# Patient Record
Sex: Female | Born: 1935 | Hispanic: No | Smoking: Never smoker
Health system: Southern US, Community
[De-identification: ages and names within clinical notes are randomized; demographics above are authoritative.]

## PROBLEM LIST (undated history)

## (undated) DIAGNOSIS — R609 Edema, unspecified: Secondary | ICD-10-CM

## (undated) DIAGNOSIS — G4700 Insomnia, unspecified: Secondary | ICD-10-CM

## (undated) DIAGNOSIS — M1990 Unspecified osteoarthritis, unspecified site: Secondary | ICD-10-CM

## (undated) DIAGNOSIS — E669 Obesity, unspecified: Secondary | ICD-10-CM

## (undated) DIAGNOSIS — R600 Localized edema: Secondary | ICD-10-CM

## (undated) DIAGNOSIS — I10 Essential (primary) hypertension: Secondary | ICD-10-CM

## (undated) DIAGNOSIS — N289 Disorder of kidney and ureter, unspecified: Secondary | ICD-10-CM

## (undated) DIAGNOSIS — N3946 Mixed incontinence: Secondary | ICD-10-CM

## (undated) HISTORY — DX: Obesity, unspecified: E66.9

## (undated) HISTORY — DX: Mixed incontinence: N39.46

## (undated) HISTORY — PX: BREAST SURGERY: SHX581

## (undated) HISTORY — DX: Essential (primary) hypertension: I10

## (undated) HISTORY — DX: Insomnia, unspecified: G47.00

## (undated) HISTORY — PX: APPENDECTOMY: SHX54

## (undated) HISTORY — PX: CHOLECYSTECTOMY: SHX55

## (undated) HISTORY — DX: Unspecified osteoarthritis, unspecified site: M19.90

---

## 1998-05-06 ENCOUNTER — Other Ambulatory Visit: Admission: RE | Admit: 1998-05-06 | Discharge: 1998-05-06 | Payer: Self-pay | Admitting: Internal Medicine

## 1999-04-28 ENCOUNTER — Other Ambulatory Visit: Admission: RE | Admit: 1999-04-28 | Discharge: 1999-04-28 | Payer: Self-pay | Admitting: Internal Medicine

## 1999-06-20 ENCOUNTER — Encounter: Payer: Self-pay | Admitting: Internal Medicine

## 1999-06-20 ENCOUNTER — Encounter: Admission: RE | Admit: 1999-06-20 | Discharge: 1999-06-20 | Payer: Self-pay | Admitting: Internal Medicine

## 2000-05-31 ENCOUNTER — Other Ambulatory Visit: Admission: RE | Admit: 2000-05-31 | Discharge: 2000-05-31 | Payer: Self-pay | Admitting: Internal Medicine

## 2001-06-06 ENCOUNTER — Other Ambulatory Visit: Admission: RE | Admit: 2001-06-06 | Discharge: 2001-06-06 | Payer: Self-pay | Admitting: Internal Medicine

## 2002-06-26 ENCOUNTER — Other Ambulatory Visit: Admission: RE | Admit: 2002-06-26 | Discharge: 2002-06-26 | Payer: Self-pay | Admitting: Internal Medicine

## 2003-06-25 ENCOUNTER — Other Ambulatory Visit: Admission: RE | Admit: 2003-06-25 | Discharge: 2003-06-25 | Payer: Self-pay | Admitting: Internal Medicine

## 2005-10-26 ENCOUNTER — Other Ambulatory Visit: Admission: RE | Admit: 2005-10-26 | Discharge: 2005-10-26 | Payer: Self-pay | Admitting: Internal Medicine

## 2007-05-19 ENCOUNTER — Emergency Department (HOSPITAL_COMMUNITY): Admission: EM | Admit: 2007-05-19 | Discharge: 2007-05-19 | Payer: Self-pay | Admitting: Family Medicine

## 2007-06-18 ENCOUNTER — Encounter (HOSPITAL_BASED_OUTPATIENT_CLINIC_OR_DEPARTMENT_OTHER): Payer: Self-pay | Admitting: General Surgery

## 2007-06-18 ENCOUNTER — Ambulatory Visit (HOSPITAL_BASED_OUTPATIENT_CLINIC_OR_DEPARTMENT_OTHER): Admission: RE | Admit: 2007-06-18 | Discharge: 2007-06-18 | Payer: Self-pay | Admitting: General Surgery

## 2007-07-18 ENCOUNTER — Encounter: Admission: RE | Admit: 2007-07-18 | Discharge: 2007-07-18 | Payer: Self-pay | Admitting: Internal Medicine

## 2007-10-23 ENCOUNTER — Encounter: Payer: Self-pay | Admitting: Internal Medicine

## 2007-10-23 ENCOUNTER — Ambulatory Visit (HOSPITAL_COMMUNITY): Admission: RE | Admit: 2007-10-23 | Discharge: 2007-10-23 | Payer: Self-pay | Admitting: Internal Medicine

## 2007-10-23 ENCOUNTER — Ambulatory Visit: Payer: Self-pay | Admitting: Surgery

## 2007-12-05 ENCOUNTER — Other Ambulatory Visit: Admission: RE | Admit: 2007-12-05 | Discharge: 2007-12-05 | Payer: Self-pay | Admitting: Internal Medicine

## 2007-12-12 ENCOUNTER — Encounter: Admission: RE | Admit: 2007-12-12 | Discharge: 2007-12-12 | Payer: Self-pay | Admitting: Internal Medicine

## 2008-11-08 IMAGING — CR DG FOOT COMPLETE 3+V*L*
3 series · 3 of 3 positions shown · non-contrast
Comparison: none

CLINICAL DATA: Pain and swelling of left foot. 
 LEFT FOOT ? COMPLETE:

[view not recorded (1 of 3)]
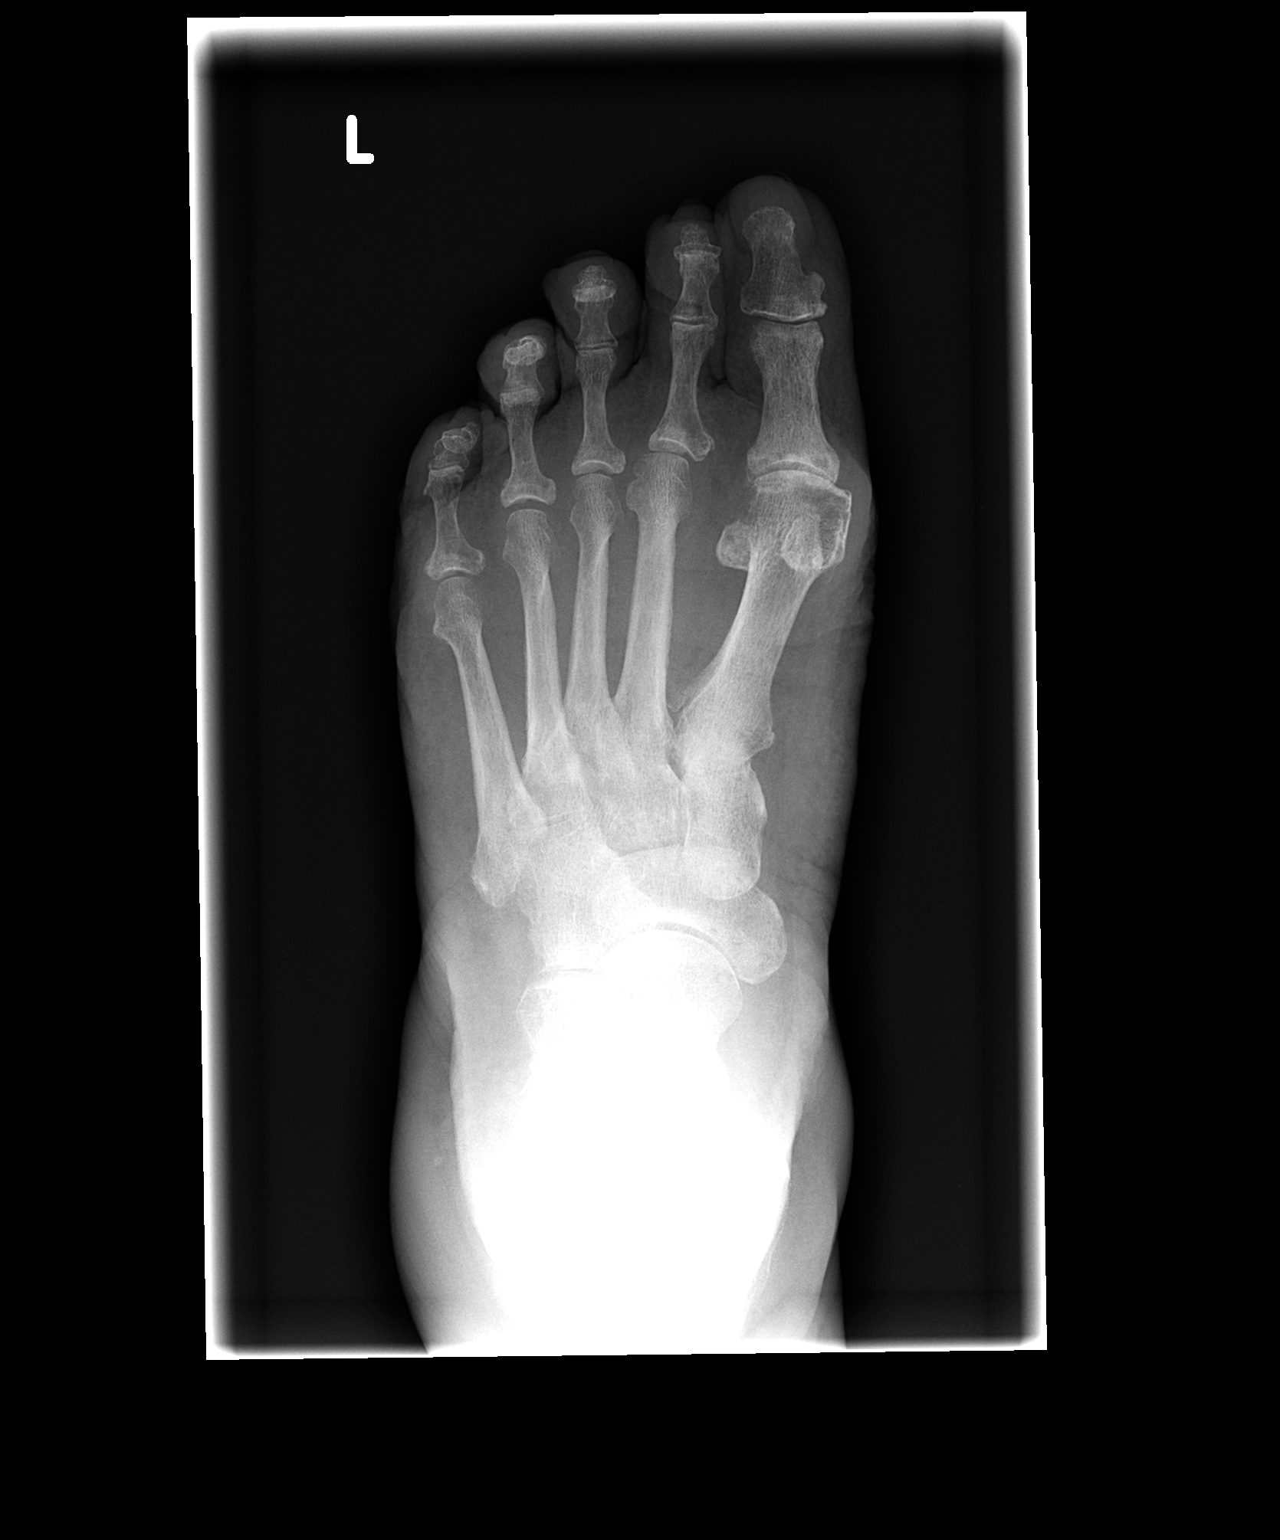

[view not recorded (2 of 3)]
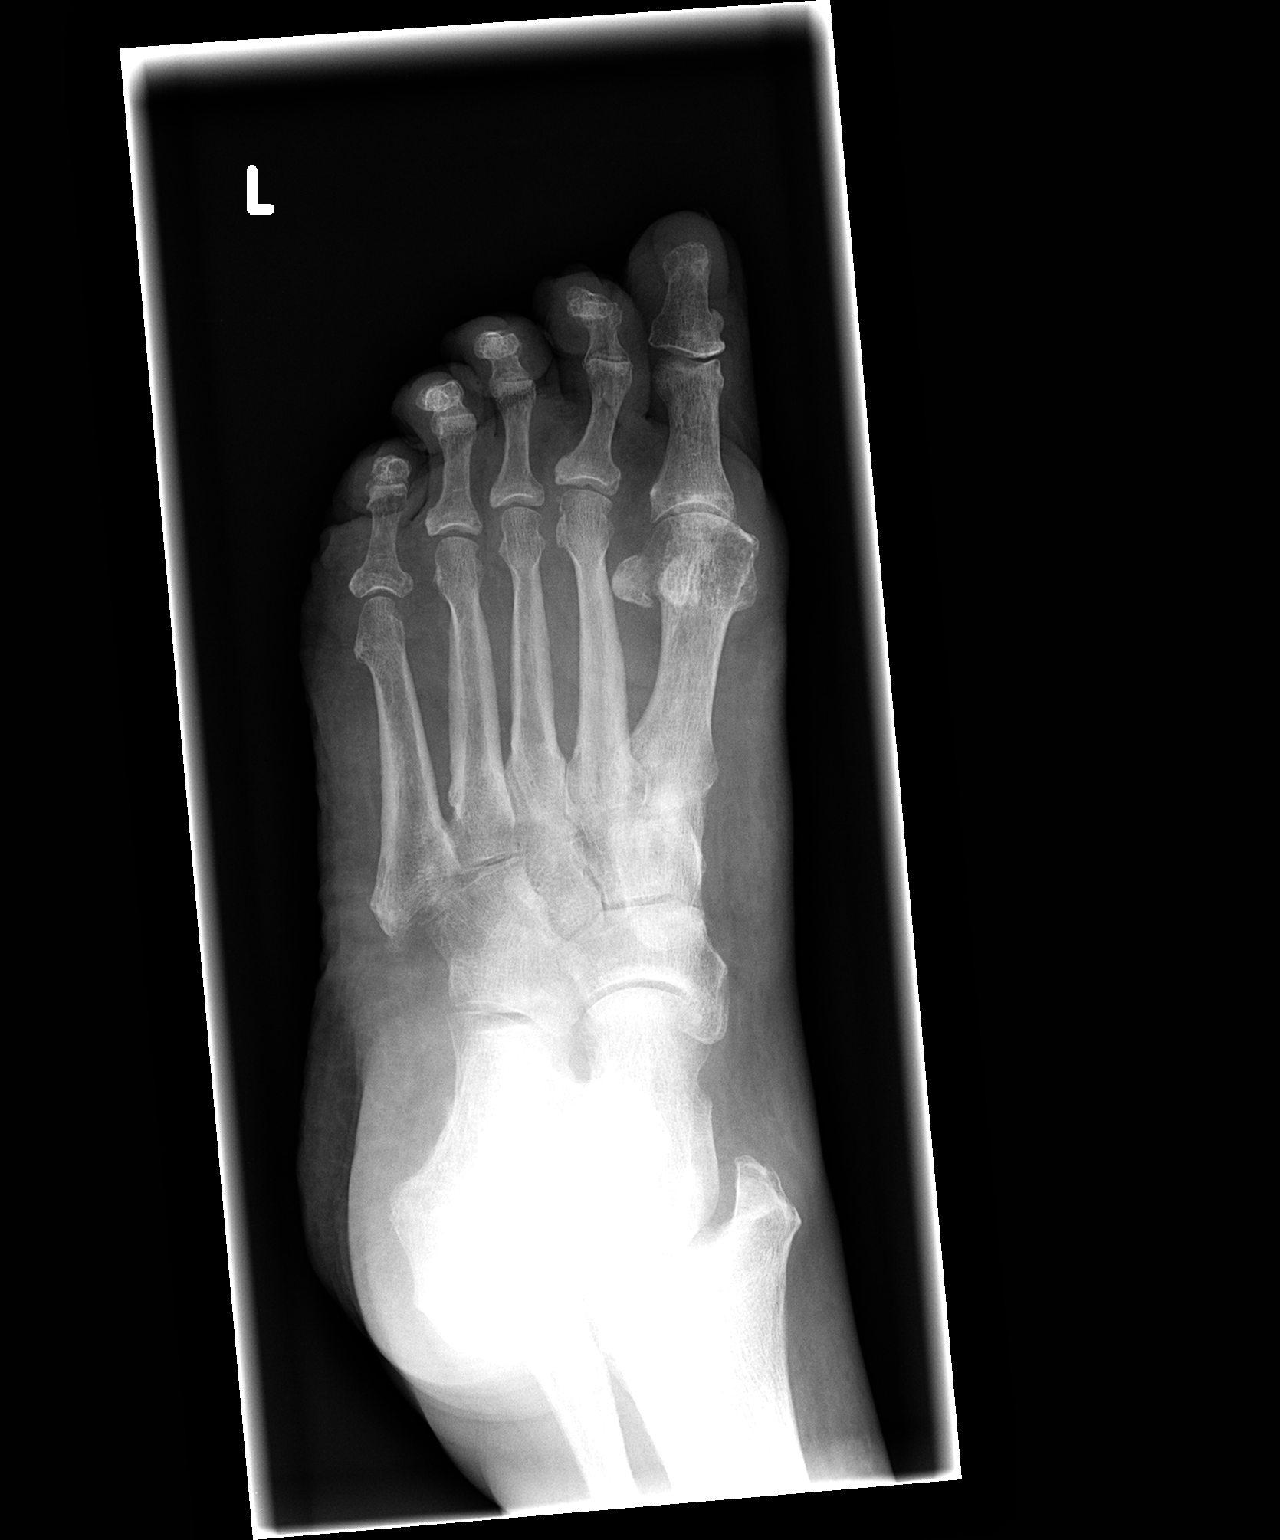

[view not recorded (3 of 3)]
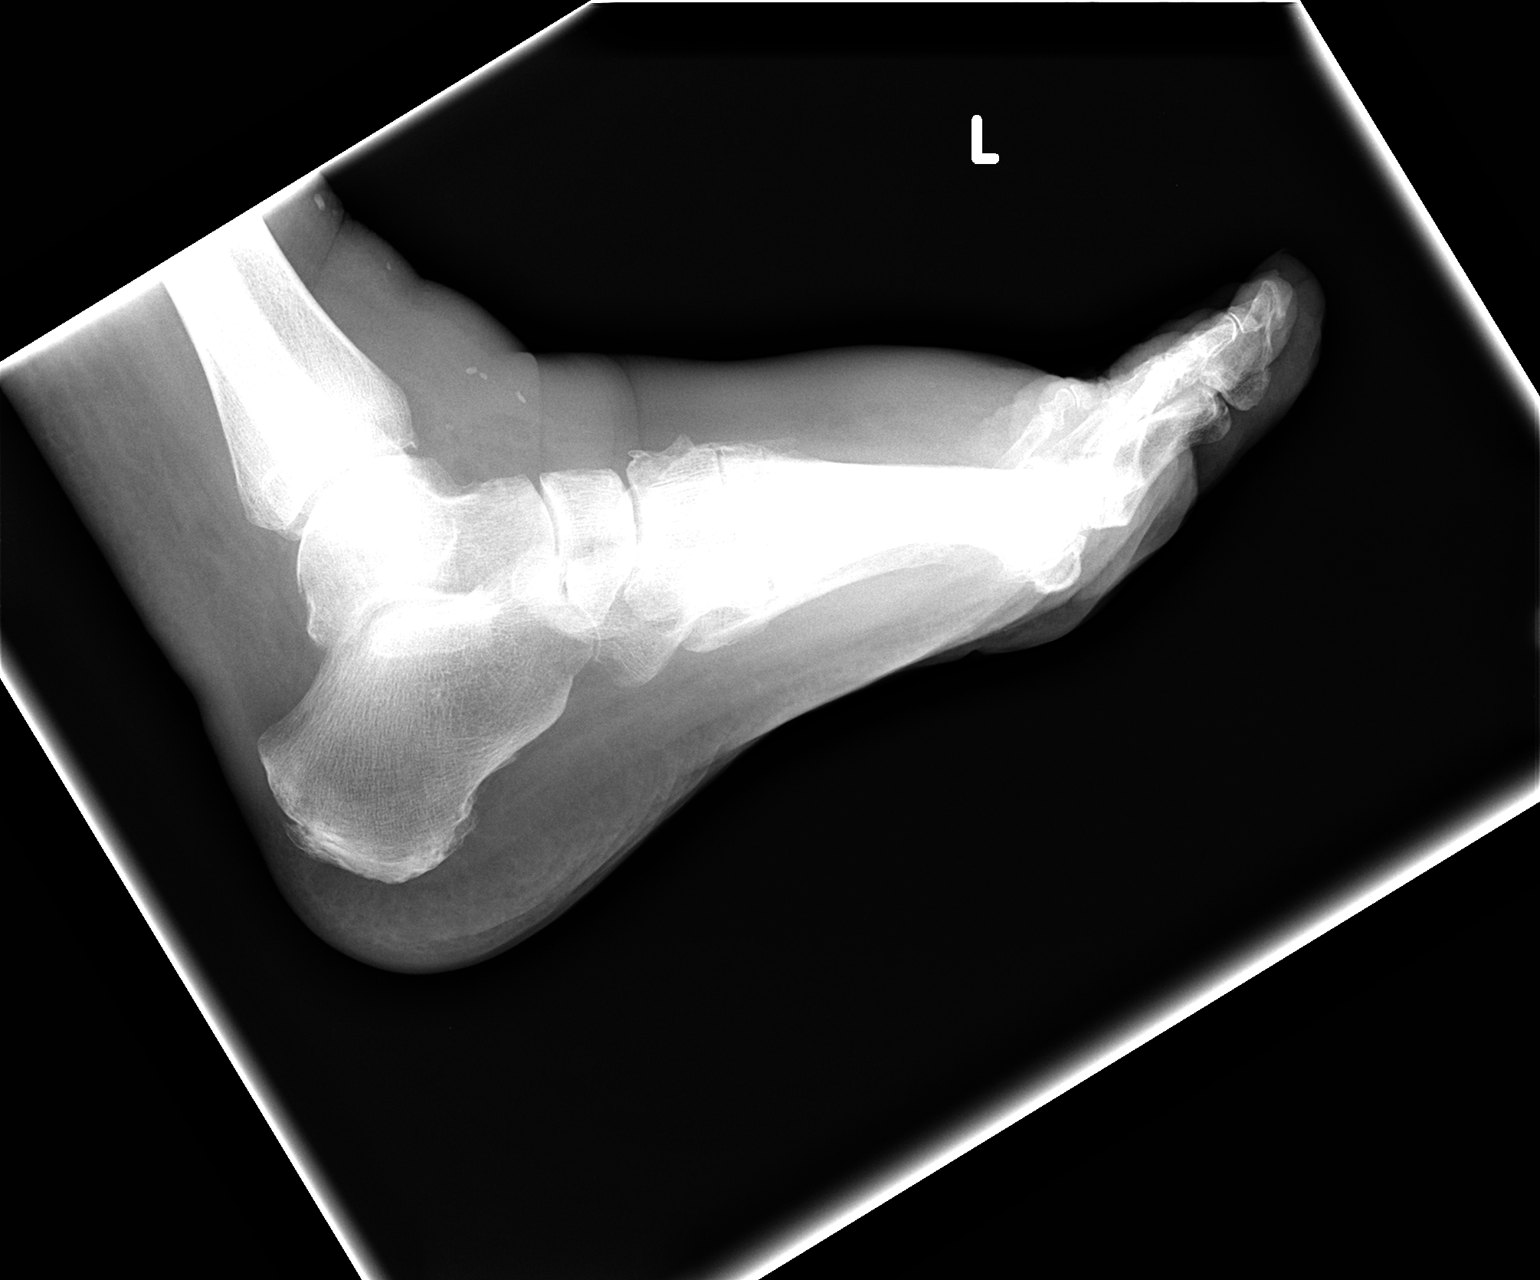

[3 of 3 positions shown; findings below may reference images not displayed]

FINDINGS: No evidence of fracture or dislocation of the left foot.  There is joint space narrowing at the first metatarsal phalangeal joint with associated sclerosis.  There is soft tissue swelling in the foot without evidence of gas.
IMPRESSION: 1.  No evidence of fracture or dislocation.
 2.  Mild degenerative changes, most prominent at the first MTP joint.

## 2009-05-20 ENCOUNTER — Ambulatory Visit: Payer: Self-pay | Admitting: Internal Medicine

## 2010-03-08 ENCOUNTER — Ambulatory Visit: Payer: Self-pay | Admitting: Internal Medicine

## 2010-08-28 ENCOUNTER — Encounter: Payer: Self-pay | Admitting: Internal Medicine

## 2010-09-16 ENCOUNTER — Other Ambulatory Visit: Payer: Self-pay | Admitting: Dermatology

## 2010-12-20 NOTE — Op Note (Signed)
NAME:  Selena Price, Selena Price NO.:  [id number]   MEDICAL RECORD NO.:  [id number]          PATIENT TYPE:  AMB   LOCATION:  DSC                          FACILITY:  [facility]   PHYSICIAN:  Leonie Man, M.D.   DATE OF BIRTH:  01-31-1923   DATE OF PROCEDURE:  06/18/2007  DATE OF DISCHARGE:                               OPERATIVE REPORT   PREOPERATIVE DIAGNOSES:  1. Epidermoid cyst of back.  2. Nevus on tip of nose.   POSTOPERATIVE DIAGNOSES:  1. Epidermoid cyst of back.  2. Nevus on tip of nose.   PROCEDURE:  Excision of epidermoid cyst of back and nevus of nose.   SURGEON:  Leonie Man, M.D.   ASSISTANT:  None.   Anesthesia is local.   The patient is an 77 year old lady who presented originally with an  infected epidermoid cyst of her back.  This was then successfully  treated with antibiotics and the patient comes in now for excision of  this lesion.  She has an incidental nevus on the tip of her nose which  she also wishes to have removed.  She comes to the operating room after  the risks and potential benefits of surgery were discussed.  All  questions answered and consent obtained.   PROCEDURE:  The patient is placed in the lateral recumbent position and  the lesion on the back is prepped and draped to be included in the  sterile operative field and infiltrated with 1% Xylocaine with  epinephrine.  An elliptical incision is made around the lesion and  deepened through the skin and subcutaneous tissue, carrying the  dissection down to the subcutaneous fat, at which point, it was  completely excised.  There was no bleeding.  The subcutaneous tissues  were closed with interrupted 3-0 Vicryl sutures.  Skin closed with 4-0  Monocryl suture and then reinforced with Steri-Strips.  Sterile dressing  applied.  The anesthetic reversed.  Patient removed from the operating  room to the recovery room in stable condition, tolerated the procedure  well.   The lesion on  the nose is prepped and draped to be included in the  sterile operative field and then infiltrated with 1% Xylocaine with  epinephrine.  I picked up the lesion in its entirety and did a shave  biopsy of this lesion for a descriptive pathologic evaluation.  Hemostasis was obtained with silver nitrate.  A Band-Aid was placed on  the incision.  The patient was then removed from the room in stable  condition.  She tolerated the procedure well.      Leonie Man, M.D.  Electronically Signed     PB/MEDQ  D:  06/18/2007  T:  06/18/2007  Job:  [id number]   cc:   Leonie Man, M.D.

## 2011-01-19 ENCOUNTER — Ambulatory Visit (INDEPENDENT_AMBULATORY_CARE_PROVIDER_SITE_OTHER): Payer: Medicare Other | Admitting: Internal Medicine

## 2011-01-19 ENCOUNTER — Encounter: Payer: Self-pay | Admitting: Internal Medicine

## 2011-01-19 DIAGNOSIS — M1990 Unspecified osteoarthritis, unspecified site: Secondary | ICD-10-CM

## 2011-01-19 DIAGNOSIS — R5381 Other malaise: Secondary | ICD-10-CM

## 2011-01-19 DIAGNOSIS — N3946 Mixed incontinence: Secondary | ICD-10-CM

## 2011-01-19 DIAGNOSIS — E669 Obesity, unspecified: Secondary | ICD-10-CM

## 2011-01-19 DIAGNOSIS — I10 Essential (primary) hypertension: Secondary | ICD-10-CM

## 2011-01-19 DIAGNOSIS — R5383 Other fatigue: Secondary | ICD-10-CM

## 2011-01-19 DIAGNOSIS — M899 Disorder of bone, unspecified: Secondary | ICD-10-CM

## 2011-01-19 LAB — CBC WITH DIFFERENTIAL/PLATELET
Basophils Relative: 0 % (ref 0–1)
Eosinophils Absolute: 0.2 10*3/uL (ref 0.0–0.7)
Eosinophils Relative: 2 % (ref 0–5)
HCT: 39.5 % (ref 36.0–46.0)
Hemoglobin: 12.9 g/dL (ref 12.0–15.0)
Lymphs Abs: 1.6 10*3/uL (ref 0.7–4.0)
MCH: 29.1 pg (ref 26.0–34.0)
MCHC: 32.7 g/dL (ref 30.0–36.0)
MCV: 89.2 fL (ref 78.0–100.0)
Monocytes Absolute: 0.6 10*3/uL (ref 0.1–1.0)
Monocytes Relative: 7 % (ref 3–12)
RBC: 4.43 MIL/uL (ref 3.87–5.11)

## 2011-01-19 LAB — LIPID PANEL
Cholesterol: 163 mg/dL (ref 0–200)
HDL: 40 mg/dL (ref >39)
Total CHOL/HDL Ratio: 4.1 Ratio
Triglycerides: 76 mg/dL (ref <150)
VLDL: 15 mg/dL (ref 0–40)

## 2011-01-19 LAB — COMPREHENSIVE METABOLIC PANEL
BUN: 14 mg/dL (ref 6–23)
CO2: 27 mEq/L (ref 19–32)
Calcium: 9 mg/dL (ref 8.4–10.5)
Chloride: 104 mEq/L (ref 96–112)
Creat: 0.97 mg/dL (ref 0.50–1.10)
Glucose, Bld: 92 mg/dL (ref 70–99)
Total Bilirubin: 0.7 mg/dL (ref 0.3–1.2)

## 2011-01-20 ENCOUNTER — Encounter: Payer: Self-pay | Admitting: Internal Medicine

## 2011-02-04 ENCOUNTER — Encounter: Payer: Self-pay | Admitting: Internal Medicine

## 2011-02-04 NOTE — Progress Notes (Signed)
  Subjective:    Patient ID: Selena Price, female    DOB: 1922/10/02, 77 y.o.   MRN: [id number]  HPI patient here today for physical examination. She lives in an extended stay motel alone. One daughter lives out of state and another daughter lives in [place]. Patient's husband was condemned a few years ago and she can't quite seem to get that problem resolved and moved back into her home. Finances are a bit of a problem as well as her ability to get organized to get things done. Daughter has suggested that patient hoards many items. Patient has a history of hypertension and obesity insomnia osteoarthritis of the knees and recurrent urinary tract infections. She has stress and urge urinary incontinence. Mood is dysthymic and she thinks people are trying to take advantage of her situation. He is unhappy with pharmacy he will no longer bill her and oil company who will no longer bill her. Long-standing history of hypertension well controlled on nifedipine and all takes as well as Tenormin and diuretic. History of dependent edema. Is never able to get urine specimen for [place]. She stays in the bathroom for extended periods of time but cannot produce specimen. Had pyelonephritis in 1998. Had basal cell carcinoma removed from her nose in 2009. Cholecystectomy and appendectomy in 1970. Benign breast biopsy 1959.    Review of Systems continues to drive, some problems ambulating but walks well with a cane. Denies chest pain or shortness of breath. Denies cough or congestion. Appetite okay. Likes to stay up late and read books. Sleeps until around noon when she starts her day     Objective:   Physical Exam skin is warm and dry; nodes none; HEENT TMs and pharynx are clear dentition is poor; neck supple no thyromegaly JVD or carotid bruits; chest clear to auscultation; cardiac exam regular rate and rhythm normal S1/S2 no gallop appreciated; abdomen obese soft nondistended no hepatosplenomegaly masses or tenderness;  pelvic exam deferred; extremities: trace lower extremity edema pulses in feet are normal; no focal deficits on brief neurological exam. Patient seemed slightly and continues to go over same subjects over and over. Possibly has early mild dementia.        Assessment & Plan:  Hypertension  Obesity  Osteoarthritis of knees  Stress and urge urinary incontinence  History of urinary tract infection  Dependent edema  History of basal cell carcinoma of the nose  Plan patient is to return in 6 months for office visit blood pressure check continue same medications

## 2011-02-04 NOTE — Patient Instructions (Signed)
Continue medications as previously prescribed. Return in 6 months

## 2011-11-23 ENCOUNTER — Encounter: Payer: Self-pay | Admitting: Internal Medicine

## 2011-11-23 ENCOUNTER — Ambulatory Visit (INDEPENDENT_AMBULATORY_CARE_PROVIDER_SITE_OTHER): Payer: Medicare Other | Admitting: Internal Medicine

## 2011-11-23 VITALS — BP 142/70 | HR 64 | Temp 98.4°F | Resp 18 | Ht 59.0 in | Wt 140.0 lb

## 2011-11-23 DIAGNOSIS — M1990 Unspecified osteoarthritis, unspecified site: Secondary | ICD-10-CM | POA: Insufficient documentation

## 2011-11-23 DIAGNOSIS — R609 Edema, unspecified: Secondary | ICD-10-CM

## 2011-11-23 DIAGNOSIS — H5461 Unqualified visual loss, right eye, normal vision left eye: Secondary | ICD-10-CM

## 2011-11-23 DIAGNOSIS — N390 Urinary tract infection, site not specified: Secondary | ICD-10-CM

## 2011-11-23 DIAGNOSIS — R5383 Other fatigue: Secondary | ICD-10-CM

## 2011-11-23 DIAGNOSIS — H5460 Unqualified visual loss, one eye, unspecified: Secondary | ICD-10-CM

## 2011-11-23 DIAGNOSIS — K219 Gastro-esophageal reflux disease without esophagitis: Secondary | ICD-10-CM | POA: Insufficient documentation

## 2011-11-23 DIAGNOSIS — I10 Essential (primary) hypertension: Secondary | ICD-10-CM | POA: Insufficient documentation

## 2011-11-23 LAB — COMPREHENSIVE METABOLIC PANEL
AST: 18 U/L (ref 0–37)
Albumin: 3.5 g/dL (ref 3.5–5.2)
Alkaline Phosphatase: 70 U/L (ref 39–117)
BUN: 20 mg/dL (ref 6–23)
Calcium: 8.6 mg/dL (ref 8.4–10.5)
Chloride: 98 mEq/L (ref 96–112)
Potassium: 3.5 mEq/L (ref 3.5–5.3)
Sodium: 138 mEq/L (ref 135–145)
Total Protein: 6.3 g/dL (ref 6.0–8.3)

## 2011-11-23 LAB — CBC WITH DIFFERENTIAL/PLATELET
Basophils Absolute: 0 10*3/uL (ref 0.0–0.1)
Eosinophils Relative: 2 % (ref 0–5)
HCT: 38 % (ref 36.0–46.0)
Lymphocytes Relative: 15 % (ref 12–46)
Lymphs Abs: 1.5 10*3/uL (ref 0.7–4.0)
MCV: 91.3 fL (ref 78.0–100.0)
Monocytes Absolute: 0.6 10*3/uL (ref 0.1–1.0)
Neutro Abs: 8.1 10*3/uL — ABNORMAL HIGH (ref 1.7–7.7)
Platelets: 317 10*3/uL (ref 150–400)
RBC: 4.16 MIL/uL (ref 3.87–5.11)
RDW: 14.3 % (ref 11.5–15.5)
WBC: 10.5 10*3/uL (ref 4.0–10.5)

## 2011-11-23 NOTE — Patient Instructions (Signed)
Continue same medications and return in 6 months 

## 2011-11-29 ENCOUNTER — Other Ambulatory Visit: Payer: Self-pay

## 2011-11-29 MED ORDER — NAPROXEN 375 MG PO TABS: 375.0000 mg | ORAL_TABLET | Freq: Two times a day (BID) | ORAL | Status: DC

## 2011-11-29 MED ORDER — RAMIPRIL 10 MG PO CAPS: 10.0000 mg | ORAL_CAPSULE | Freq: Every day | ORAL | Status: DC

## 2011-11-29 MED ORDER — KETOCONAZOLE 2 % EX CREA: TOPICAL_CREAM | Freq: Every day | CUTANEOUS | Status: DC

## 2011-11-29 MED ORDER — ATENOLOL 25 MG PO TABS: 25.0000 mg | ORAL_TABLET | Freq: Every day | ORAL | Status: DC

## 2011-11-29 MED ORDER — OXYBUTYNIN CHLORIDE 5 MG PO TABS: 5.0000 mg | ORAL_TABLET | Freq: Two times a day (BID) | ORAL | Status: DC

## 2011-11-29 MED ORDER — NIFEDIPINE ER 60 MG PO TB24: 60.0000 mg | ORAL_TABLET | Freq: Every day | ORAL | Status: DC

## 2011-11-29 MED ORDER — ESOMEPRAZOLE MAGNESIUM 40 MG PO CPDR: 40.0000 mg | DELAYED_RELEASE_CAPSULE | Freq: Every day | ORAL | Status: AC

## 2011-11-29 MED ORDER — TORSEMIDE 20 MG PO TABS: 20.0000 mg | ORAL_TABLET | Freq: Every day | ORAL | Status: DC

## 2011-12-04 NOTE — Progress Notes (Signed)
  Subjective:    Patient ID: Selena Price, female    DOB: 18-Aug-1922, 78 y.o.   MRN: [id number]  HPI 78 year old white female widow who lives in an extended stay motel in today for six-month followup and evaluation of medical problems. She has a DMV form that needs to be completed. She doesn't drive on interstates. No night driving. Patient has a history of hypertension, recurrent urinary tract infections, degenerative joint disease of the knees. History of stress and urge urinary incontinence. Is intolerant of Wytensin it makes her sleepy otherwise no known drug allergies. Patient had pyelonephritis 1997/01/09, infected sebaceous cyst on back 01-10-2007. Benign left breast tumor removed approximately 1959. Cholecystectomy and appendectomy 1970. Basal cell carcinoma of nose removed 01-10-08. History of dependent edema. Had Pneumovax immunization October 2010, tetanus immunization 01/10/1996. She has 2 daughters. One daughter has a female partner and together they have a child who has significant handicaps according to the patient. They live out of state for while but patient says they now live in [place]. She has another daughter who is married to a Print production planner in [place]. Patient's husband was on faculty at [employer]. She formerly was a Chartered loss adjuster. [place] [address] [place] the city and she has not been able to make the necessary repairs to return to live there so she lives in an extended stay motel. Emergency contact is her daughter Selena Price phone # [phone]. Patient does not have a living will on file here. Husband died in 1992-01-10 of a cardiac arrest.  Patient's daughter called me in 10-Jan-2007 about patient having a tendency toward newspapers and magazines. She likes to her. She also apparently has a large number of close it she refuses to get rid of as well. Apparently she had a lapse in her liability insurance and did not notice from [facility] regarding a hearing in January 10, 2004. This may have started her  reviewed by the DMV. She does not seem to be demented but has an unusual affect. Seems lonely. She is to drive to the [employer] but I think nail just gets things from the grocery store and eats in her room. Sometimes goes to [employer].  Patient has a Event organiser. Does not smoke or consume alcohol. Enjoys doll collecting.  Family history: Father died at age 67 of prostate cancer. Mother died at age 62 of arterial sclerotic cardiovascular disease.    Review of Systems     Objective:   Physical Exam neck no JVD thyromegaly or carotid bruits; chest clear to auscultation; cardiac exam regular rate and rhythm normal S1 and S2; extremities trace lower extremity edema        Assessment & Plan:  Hypertension  Osteoarthritis of the knees  History of dependent edema  History of stress and urge urinary incontinence  History of urinary tract infections. She can never give a urine specimen here in the office.  Plan: Patient to return in 6 months for physical examination. DMV form completed.

## 2012-01-30 DIAGNOSIS — Z0289 Encounter for other administrative examinations: Secondary | ICD-10-CM

## 2012-02-06 ENCOUNTER — Encounter: Payer: Medicare Other | Admitting: Internal Medicine

## 2012-02-19 ENCOUNTER — Telehealth: Payer: Self-pay | Admitting: Internal Medicine

## 2012-05-21 ENCOUNTER — Telehealth: Payer: Self-pay | Admitting: Internal Medicine

## 2012-05-21 ENCOUNTER — Encounter (HOSPITAL_COMMUNITY): Payer: Self-pay | Admitting: *Deleted

## 2012-05-21 ENCOUNTER — Emergency Department (HOSPITAL_COMMUNITY): Payer: Medicare Other

## 2012-05-21 ENCOUNTER — Emergency Department (HOSPITAL_COMMUNITY)
Admission: EM | Admit: 2012-05-21 | Discharge: 2012-05-21 | Disposition: A | Discharge status: Home / Self Care | Payer: Medicare Other | Attending: Emergency Medicine | Admitting: Emergency Medicine

## 2012-05-21 DIAGNOSIS — R600 Localized edema: Secondary | ICD-10-CM | POA: Insufficient documentation

## 2012-05-21 DIAGNOSIS — R079 Chest pain, unspecified: Secondary | ICD-10-CM | POA: Insufficient documentation

## 2012-05-21 DIAGNOSIS — Z79899 Other long term (current) drug therapy: Secondary | ICD-10-CM | POA: Insufficient documentation

## 2012-05-21 DIAGNOSIS — Z9089 Acquired absence of other organs: Secondary | ICD-10-CM | POA: Insufficient documentation

## 2012-05-21 DIAGNOSIS — I4891 Unspecified atrial fibrillation: Secondary | ICD-10-CM | POA: Insufficient documentation

## 2012-05-21 DIAGNOSIS — I10 Essential (primary) hypertension: Secondary | ICD-10-CM | POA: Insufficient documentation

## 2012-05-21 DIAGNOSIS — R609 Edema, unspecified: Secondary | ICD-10-CM | POA: Insufficient documentation

## 2012-05-21 HISTORY — DX: Disorder of kidney and ureter, unspecified: N28.9

## 2012-05-21 HISTORY — DX: Localized edema: R60.0

## 2012-05-21 HISTORY — DX: Edema, unspecified: R60.9

## 2012-05-21 LAB — PROTIME-INR
INR: 1.07 (ref 0.00–1.49)
Prothrombin Time: 13.8 seconds (ref 11.6–15.2)

## 2012-05-21 LAB — CBC WITH DIFFERENTIAL/PLATELET
Basophils Relative: 0 % (ref 0–1)
Eosinophils Absolute: 0.2 10*3/uL (ref 0.0–0.7)
Hemoglobin: 12.1 g/dL (ref 12.0–15.0)
MCH: 29.4 pg (ref 26.0–34.0)
MCHC: 33.2 g/dL (ref 30.0–36.0)
Monocytes Relative: 7 % (ref 3–12)
Neutrophils Relative %: 80 % — ABNORMAL HIGH (ref 43–77)

## 2012-05-21 LAB — COMPREHENSIVE METABOLIC PANEL
Albumin: 3.1 g/dL — ABNORMAL LOW (ref 3.5–5.2)
Alkaline Phosphatase: 77 U/L (ref 39–117)
BUN: 13 mg/dL (ref 6–23)
Calcium: 9.1 mg/dL (ref 8.4–10.5)
Creatinine, Ser: 1.22 mg/dL — ABNORMAL HIGH (ref 0.50–1.10)
Potassium: 3.5 mEq/L (ref 3.5–5.1)
Total Protein: 6.9 g/dL (ref 6.0–8.3)

## 2012-05-21 LAB — POCT I-STAT TROPONIN I: Troponin i, poc: 0.03 ng/mL (ref 0.00–0.08)

## 2012-05-21 LAB — TSH: TSH: 2.078 u[IU]/mL (ref 0.350–4.500)

## 2012-05-21 MED ORDER — DILTIAZEM HCL 100 MG IV SOLR
5.0000 mg/h | Freq: Once | INTRAVENOUS | Status: AC
  Administered 2012-05-21: 5 mg/h via INTRAVENOUS
  Filled 2012-05-21: qty 100

## 2012-05-21 MED ORDER — SODIUM CHLORIDE 0.9 % IV SOLN
Freq: Once | INTRAVENOUS | Status: AC
  Administered 2012-05-21: 12:00:00 via INTRAVENOUS

## 2012-05-21 MED ORDER — SODIUM CHLORIDE 0.9 % IV SOLN: Freq: Once | INTRAVENOUS | Status: DC

## 2012-05-21 NOTE — ED Notes (Signed)
C/o intermittent episodes heart racing x 2 days. Per EMS - A fib with RVR. HR 97-170.  Denies CP

## 2012-05-21 NOTE — ED Notes (Signed)
Reports intermittent episodes of heart racing, palpitations x 2 days. Denies CP, SOB, lightheadedness, dizziness. A&OX4.

## 2012-05-21 NOTE — Telephone Encounter (Signed)
Sp with Dr. Lenord Fellers to advise of the message.  Per Dr. Lenord Fellers, patient should go directly to the ED for treatment.   Called Daughter, Lanora Manis back to advise.  Daughter indicated that neighbors went over to check on her and they decided to go ahead and call 911 and the ambulance is at her residence at present to transport her.  Daughter is still on her way to [place].  Advised daughter that patient has CPE scheduled with [place] next week.  She'll keep [place] posted on what happens with the patient.

## 2012-05-21 NOTE — ED Provider Notes (Signed)
History     CSN: [id number]  Arrival date & time 05/21/12  1104   First MD Initiated Contact with Patient 05/21/12 1132      Chief Complaint  Patient presents with  . Irregular Heart Beat  . Palpitations    (Consider location/radiation/quality/duration/timing/severity/associated sxs/prior treatment) HPI Comments: Selena Price is a 78 y.o. Female who presents with complaint of "palpitations" on and off for about a week. States worse in the last 2 days. Some discomfort over left chest, states "feels like someone pulling on my breast, I thought I had a breast cancer or something." States both chest pain and palpitations are not reproducible. States currently feels like heart is racing, no other complaints. Denies nausea, vomiting, dizziness, shortness of breath. Pt states she lives at home alone. Frequently visited by her daughter who is here with her right now. Pt denies any prior cardiac problems and does not have a cardiologist.   The history is provided by the patient.    Past Medical History  Diagnosis Date  . Hypertension   . Obesity   . Insomnia   . Arthritis     osteoarthritis  . Urinary incontinence, mixed   . Degenerative joint disease   . Peripheral edema   . Renal disorder     Past Surgical History  Procedure Date  . Breast surgery     biopsy l breast/ benign  . Appendectomy   . Cholecystectomy     Family History  Problem Relation Age of Onset  . Heart disease Mother   . Cancer Father     History  Substance Use Topics  . Smoking status: Never Smoker   . Smokeless tobacco: Never Used  . Alcohol Use: No    OB History    Grav Para Term Preterm Abortions TAB SAB Ect Mult Living                  Review of Systems  Constitutional: Negative for fever and chills.  Respiratory: Positive for chest tightness. Negative for cough and shortness of breath.   Cardiovascular: Positive for chest pain and palpitations. Negative for leg swelling.    Gastrointestinal: Negative for nausea, vomiting and abdominal pain.  Genitourinary: Negative for dysuria.  Musculoskeletal: Negative.   Skin: Negative.   Neurological: Negative for dizziness, light-headedness and headaches.  Psychiatric/Behavioral: Negative.     Allergies  Review of patient's allergies indicates no known allergies.  Home Medications   Current Outpatient Rx  Name Route Sig Dispense Refill  . ACETAMINOPHEN 500 MG PO TABS Oral Take 500 mg by mouth every 6 (six) hours as needed. For pain    . ATENOLOL 25 MG PO TABS Oral Take 1 tablet (25 mg total) by mouth daily. 30 tablet 11  . ESOMEPRAZOLE MAGNESIUM 40 MG PO CPDR Oral Take 1 capsule (40 mg total) by mouth daily before breakfast. 30 capsule 11  . OXYBUTYNIN CHLORIDE 5 MG PO TABS Oral Take 1 tablet (5 mg total) by mouth 2 (two) times daily before a meal. 60 tablet 11  . RAMIPRIL 10 MG PO CAPS Oral Take 1 capsule (10 mg total) by mouth daily. 30 capsule 11  . TORSEMIDE 20 MG PO TABS Oral Take 1 tablet (20 mg total) by mouth daily. One to three tabs q am prn 30 tablet 11  . NIFEDIPINE ER 60 MG PO TB24 Oral Take 1 tablet (60 mg total) by mouth daily. 60 tablet 11    BP 171/90  Temp 98.8  F (37.1 C) (Oral)  Resp 20  SpO2 100%  Physical Exam  Nursing note and vitals reviewed. Constitutional: She is oriented to person, place, and time. She appears well-developed and well-nourished. No distress.  HENT:  Head: Normocephalic.  Eyes: Conjunctivae normal are normal.  Neck: Neck supple.  Cardiovascular: Normal heart sounds.  An irregular rhythm present. Tachycardia present.   Pulmonary/Chest: Effort normal and breath sounds normal. No respiratory distress. She has no wheezes. She has no rales.  Abdominal: Soft. Bowel sounds are normal. She exhibits no distension. There is no tenderness. There is no rebound.  Musculoskeletal:       1+ pitting peripheral edema, bialterally  Neurological: She is alert and oriented to person,  place, and time.  Skin: Skin is warm and dry.    ED Course  Procedures (including critical care time)  Pt is found to be in a-fib RVR with some ST depressions on ECG. Will start fluids, cardizem. Pt AAOx3, she is in no distress. Labs pending. She has no hx of afib or any other cardiac problems.   1:05 PM Pt converted to NSR after cardizem was boluses and drip started. HR in 60s. cardizem drip stopped.  Labs still pending.   Results for orders placed during the hospital encounter of 05/21/12  CBC WITH DIFFERENTIAL      Component Value Range   WBC 9.9  4.0 - 10.5 K/uL   RBC 4.11  3.87 - 5.11 MIL/uL   Hemoglobin 12.1  12.0 - 15.0 g/dL   HCT 16.1  09.6 - 04.5 %   MCV 88.8  78.0 - 100.0 fL   MCH 29.4  26.0 - 34.0 pg   MCHC 33.2  30.0 - 36.0 g/dL   RDW 40.9  81.1 - 91.4 %   Platelets 269  150 - 400 K/uL   Neutrophils Relative 80 (*) 43 - 77 %   Neutro Abs 7.8 (*) 1.7 - 7.7 K/uL   Lymphocytes Relative 12  12 - 46 %   Lymphs Abs 1.2  0.7 - 4.0 K/uL   Monocytes Relative 7  3 - 12 %   Monocytes Absolute 0.7  0.1 - 1.0 K/uL   Eosinophils Relative 2  0 - 5 %   Eosinophils Absolute 0.2  0.0 - 0.7 K/uL   Basophils Relative 0  0 - 1 %   Basophils Absolute 0.0  0.0 - 0.1 K/uL  COMPREHENSIVE METABOLIC PANEL      Component Value Range   Sodium 139  135 - 145 mEq/L   Potassium 3.5  3.5 - 5.1 mEq/L   Chloride 104  96 - 112 mEq/L   CO2 27  19 - 32 mEq/L   Glucose, Bld 105 (*) 70 - 99 mg/dL   BUN 13  6 - 23 mg/dL   Creatinine, Ser 7.82 (*) 0.50 - 1.10 mg/dL   Calcium 9.1  8.4 - 95.6 mg/dL   Total Protein 6.9  6.0 - 8.3 g/dL   Albumin 3.1 (*) 3.5 - 5.2 g/dL   AST 17  0 - 37 U/L   ALT 11  0 - 35 U/L   Alkaline Phosphatase 77  39 - 117 U/L   Total Bilirubin 0.2 (*) 0.3 - 1.2 mg/dL   GFR calc non Af Amer 39 (*) >90 mL/min   GFR calc Af Amer 45 (*) >90 mL/min  PROTIME-INR      Component Value Range   Prothrombin Time 13.8  11.6 - 15.2 seconds  INR 1.07  0.00 - 1.49  POCT I-STAT TROPONIN  I      Component Value Range   Troponin i, poc 0.03  0.00 - 0.08 ng/mL   Comment 3            Dg Chest Portable 1 View  05/21/2012  *RADIOLOGY REPORT*  Clinical Data: Irregular heart beat  PORTABLE CHEST - 1 VIEW  Comparison: .None.  Findings: Poor inspiratory exam.  Pulmonary vascular congestion most notable centrally.  Increased markings peripheral aspect right mid to lower lung zone and left lung base may represent atelectatic changes rather than infiltrate and can be assessed on follow-up.  Cardiomegaly.  Calcified mildly tortuous aorta.  IMPRESSION: Cardiomegaly with pulmonary vascular prominence most notable centrally.  Atelectatic changes right mid to lower lung zone left base suspected.  This can be assessed on follow-up if there are any progressive symptoms.   Original Report Authenticated By: Fuller Canada, M.D.      1. Atrial fibrillation with RVR   2. Peripheral edema       MDM  Pt with new onset of Afib with RVR, resolved and converted to NSR with cardizem in ER. Pt apparently on atenalol which she is  Not taking. Her VS now are normal. She is asymptomatic. Troponin and labs unremarkable. Pt stable for d/c home with cardiology and PCP follow up. Discussed findings and lab results with pt and her daughter, they are agreeable to the plan. Will d/c home with follow up        Lottie Mussel, PA 05/21/12 1616

## 2012-05-21 NOTE — ED Provider Notes (Signed)
78 year old female noted onset this morning of palpitations. There is no associated chest pain, dyspnea, nausea, diaphoresis. When she arrived, she was noted to be in atrial fibrillation with rapid ventricular response. She was started on adult size and drip with good control of rate, and she spontaneously converted to sinus rhythm. On exam, lungs are clear and heart has regular rate and rhythm. She has 2+ pitting edema. It is noted that she is on atenolol and furosemide, but apparently she did not take them with any degree of irregularity. She will be referred to cardiology for outpatient workup. Thyroid studies are ordered in the emergency department. She is encouraged to take her medications on a daily basis. Dr. Delorise Shiner may need to be titrated.   Date: 05/21/2012  Rate: 145  Rhythm: atrial fibrillation  QRS Axis: normal  Intervals: normal  ST/T Wave abnormalities: Anterolateral and lateral leads suggestive of ischemia, possibly rate related  Conduction Disutrbances:none  Narrative Interpretation: Atrial fibrillation with rapid ventricular response. Diffuse ST depression suggestive of ischemia which is probably rate related. No prior ECG available for comparison.  Old EKG Reviewed: none available   Date: 05/21/2012 at 1300  Rate: 57  Rhythm: sinus bradycardia  QRS Axis: normal  Intervals: normal  ST/T Wave abnormalities: normal  Conduction Disutrbances:none  Narrative Interpretation: Sinus bradycardia, otherwise normal ECG. When compared with prior ECG, atrial fibrillation with rapid ventricular response has been replaced with normal sinus rhythm, ST depression has resolved suggesting that it was due to tachycardia.  Old EKG Reviewed: changes noted  Medical screening examination/treatment/procedure(s) were conducted as a shared visit with non-physician practitioner(s) and myself.  I personally evaluated the patient during the encounter    Dione Booze, MD 05/21/12 1317

## 2012-05-21 NOTE — Telephone Encounter (Signed)
Should we see her?  Bonita Quin and I spoke and know that we can do an EKG, but how else are we to help elevated heart rate?  Please advise if you want to see her or send her to the ER or what?  Daughter is awaiting a call from me.

## 2012-05-22 ENCOUNTER — Telehealth: Payer: Self-pay

## 2012-05-22 DIAGNOSIS — I4891 Unspecified atrial fibrillation: Secondary | ICD-10-CM

## 2012-05-22 NOTE — Telephone Encounter (Signed)
Patient scheduled for an appointment with Dr.Nahser on 06/11/2012 at 4:30 pm. She is aware of this.

## 2012-05-27 ENCOUNTER — Other Ambulatory Visit: Payer: Medicare Other | Admitting: Internal Medicine

## 2012-05-28 ENCOUNTER — Encounter: Payer: Medicare Other | Admitting: Internal Medicine

## 2012-05-30 ENCOUNTER — Other Ambulatory Visit: Payer: Medicare Other | Admitting: Internal Medicine

## 2012-05-30 ENCOUNTER — Ambulatory Visit: Payer: Medicare Other | Admitting: Internal Medicine

## 2012-05-30 DIAGNOSIS — M810 Age-related osteoporosis without current pathological fracture: Secondary | ICD-10-CM

## 2012-05-30 DIAGNOSIS — I10 Essential (primary) hypertension: Secondary | ICD-10-CM

## 2012-05-30 LAB — LIPID PANEL
LDL Cholesterol: 108 mg/dL — ABNORMAL HIGH (ref 0–99)
Total CHOL/HDL Ratio: 4.5 Ratio
Triglycerides: 72 mg/dL (ref <150)
VLDL: 14 mg/dL (ref 0–40)

## 2012-05-30 LAB — CBC WITH DIFFERENTIAL/PLATELET
Basophils Relative: 0 % (ref 0–1)
Eosinophils Absolute: 0.3 10*3/uL (ref 0.0–0.7)
Eosinophils Relative: 3 % (ref 0–5)
MCH: 29.1 pg (ref 26.0–34.0)
MCHC: 33.1 g/dL (ref 30.0–36.0)
Neutrophils Relative %: 72 % (ref 43–77)
Platelets: 300 10*3/uL (ref 150–400)
RDW: 14.7 % (ref 11.5–15.5)

## 2012-05-30 LAB — COMPREHENSIVE METABOLIC PANEL
ALT: 10 U/L (ref 0–35)
Alkaline Phosphatase: 68 U/L (ref 39–117)
Creat: 1.16 mg/dL — ABNORMAL HIGH (ref 0.50–1.10)
Sodium: 140 mEq/L (ref 135–145)
Total Bilirubin: 0.6 mg/dL (ref 0.3–1.2)
Total Protein: 6.4 g/dL (ref 6.0–8.3)

## 2012-05-31 ENCOUNTER — Ambulatory Visit (INDEPENDENT_AMBULATORY_CARE_PROVIDER_SITE_OTHER): Payer: Medicare Other | Admitting: Internal Medicine

## 2012-05-31 ENCOUNTER — Encounter: Payer: Self-pay | Admitting: Internal Medicine

## 2012-05-31 VITALS — BP 142/64 | HR 52 | Temp 99.0°F | Ht <= 58 in | Wt 140.0 lb

## 2012-05-31 DIAGNOSIS — Z0000 Encounter for general adult medical examination without abnormal findings: Secondary | ICD-10-CM

## 2012-05-31 DIAGNOSIS — N390 Urinary tract infection, site not specified: Secondary | ICD-10-CM

## 2012-05-31 DIAGNOSIS — I480 Paroxysmal atrial fibrillation: Secondary | ICD-10-CM

## 2012-05-31 DIAGNOSIS — F918 Other conduct disorders: Secondary | ICD-10-CM

## 2012-05-31 DIAGNOSIS — Z23 Encounter for immunization: Secondary | ICD-10-CM

## 2012-05-31 DIAGNOSIS — F423 Hoarding disorder: Secondary | ICD-10-CM

## 2012-05-31 DIAGNOSIS — Z124 Encounter for screening for malignant neoplasm of cervix: Secondary | ICD-10-CM

## 2012-05-31 DIAGNOSIS — I4891 Unspecified atrial fibrillation: Secondary | ICD-10-CM

## 2012-05-31 DIAGNOSIS — I10 Essential (primary) hypertension: Secondary | ICD-10-CM

## 2012-05-31 LAB — POCT URINALYSIS DIPSTICK
Bilirubin, UA: NEGATIVE
Ketones, UA: NEGATIVE
pH, UA: 7

## 2012-06-01 DIAGNOSIS — F423 Hoarding disorder: Secondary | ICD-10-CM | POA: Insufficient documentation

## 2012-06-01 NOTE — Progress Notes (Signed)
Subjective:    Patient ID: Selena Price, female    DOB: [birth year], 78 y.o.   MRN: [id number]  HPI 78 year old white female widow lives in an extended stay motel but owns family home  on [address] here in [place]. Every time I see her she says that house repairs have yet to be done to correct the city ordinance condemning the property on [address]. This matter is been going on for several years. In and will and in and in and and and in and She does not want to sell the property. Says number of realtors approached her as well as neighbors about buying the property. I don't have much longer it will be safe for her to reside in an extended stay motel alone. She recently had an episode of paroxysmal atrial fibrillation. Her daughter came from [place] and took her to the hospital at our advice. She says that she doesn't like driving [employer] certain hours of the day. She is asking her feet are swollen but has not been taking diuretic with any consistency. Says it aggravates urinary incontinence. She almost always has a urinary tract infection. She is not cooperative about given the urine specimen. We had to do an in and out quit cath today to get a specimen. Her urine was cloudy. She wears panty liners. Says sometimes she has incontinence of stool in addition to incontinence of urine. She does not go to the dentist with any regularity. Has not had a mammogram since 2000 according to my records.  Had Pneumovax immunization October 2010.  She did see Dr. Bradly Chris in 1999 for surgical removal of tooth #4 and tooth #15.  Her daughter is Selena Price and her contact number is [phone] or [email or web address] . This daughter contacted me several years ago about patient having hoarding behavior. We noticed that the back seat of her car was tacked with magazines newspapers. Daughter indicated this type of behavior had gone on for years and wondered if I could do something about it.  Explained to her that was not easy to do and require psychiatric intervention which she was unwilling to confront her mother about.  Is intolerant of Wytensin- it causes sleepiness. Otherwise no known drug allergies  Benign left breast biopsy 1959, cholecystectomy and appendectomy in 1970. Has been a patient in this practice since 1993 at which time she was 78 years old and had a history of hypertension at that time dating back to at least 1984.  Husband was a retired Conservation officer, nature. 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 Husband died of an MI suddenly. Patient does not smoke. Very rarely has a glass of wine.  History of pyelonephritis 1998, infected sebaceous cyst on back removed in 2008. Basal cell carcinoma removed from her nose in 2009.  History of obesity, osteoarthritis, stress and urge urinary incontinence, degenerative joint disease in her knees.    Review of Systems  Constitutional: Positive for fatigue.  HENT: Negative.   Eyes: Negative.   Respiratory: Negative.   Cardiovascular:        Says she's been having a lot of stress secondary to his house problems which she believes led to palpitations and elevated blood pressure  Genitourinary: Positive for frequency.       Denies dysuria. History of urge and stress urinary incontinence.  Musculoskeletal: Positive for back pain and joint swelling.       Joint pain  in her knees. Ankle swelling.  Neurological: Negative.   Hematological: Negative.   Psychiatric/Behavioral: Positive for dysphoric mood.       Objective:   Physical Exam  Constitutional: She is oriented to person, place, and time. She appears well-developed and well-nourished. No distress.       Elderly female with poor dentition  HENT:  Head: Normocephalic and atraumatic.  Right Ear: External ear normal.  Left Ear: External ear normal.  Mouth/Throat: Oropharynx is clear and moist. No oropharyngeal exudate.  Eyes:  Conjunctivae normal and EOM are normal. Pupils are equal, round, and reactive to light. Right eye exhibits no discharge. Left eye exhibits no discharge. No scleral icterus.  Neck: Neck supple. No JVD present. No thyromegaly present.  Cardiovascular: Normal rate, regular rhythm, normal heart sounds and intact distal pulses.   Pulmonary/Chest: Effort normal and breath sounds normal.       Breast without masses  Abdominal: Soft. Bowel sounds are normal. She exhibits no distension. There is no tenderness. There is no guarding.       Large umbilical hernia that is reducible  Genitourinary:       Cystocele and rectocele are present. In out quit cath urine specimen obtained. Patient is not impacted.  Musculoskeletal:       1+ pitting edema lower extremities. Decreased range of motion in her knees and hips  Lymphadenopathy:    She has no cervical adenopathy.  Neurological: She is alert and oriented to person, place, and time. She has normal reflexes. She displays normal reflexes. No cranial nerve deficit. Coordination normal.  Skin: Skin is warm and dry. She is not diaphoretic.       Excoriations on her back. Patient says back is been itching.  Psychiatric: Her behavior is normal. Judgment normal.       Dysphoria          Assessment & Plan:  New onset paroxysmal atrial fibrillation-spontaneously converted in emergency department. Patient to see cardiologist November 5.  Hypertension long-standing since proximally 1984  Lower extremity edema secondary to not being compliant with diuretic therapy  Urinary tract infection-start Cipro 250 mg twice daily for 10 days. Urine culture pending. History of recurrent urinary tract infections.  History of stress and urge urinary incontinence-has cystocele  Osteoarthritis of knees  History  hoarding behavior according to daughter  With regard to safety, it may be that she no longer needs to live alone in an extended stay motel situation. Advised  patient to consider assisted living facility but she says finances are an issue. At some point she will not be able to drive. She doesn't drive at night. Return in 6 months. Will try to reach out to daughter and let her know these thoughts.  Subjective:   Patient presents for Medicare Annual/Subsequent preventive examination.   Review Past Medical/Family/Social: See EPIC   Risk Factors  Current exercise habits: sedentary Dietary issues discussed: Not able to exercise due to arthritis  Cardiac risk factors:  Depression Screen  (Note: if answer to either of the following is "Yes", a more complete depression screening is indicated)   Over the past two weeks, have you felt down, depressed or hopeless? No  Over the past two weeks, have you felt little interest or pleasure in doing things? No Have you lost interest or pleasure in daily life? No Do you often feel hopeless? No Do you cry easily over simple problems? No   Activities of Daily Living  In your present state of  health, do you have any difficulty performing the following activities?:   Driving? No except certain times on [place]  Managing money? No  Feeding yourself? No  Getting from bed to chair? No  Climbing a flight of stairs? yes  Preparing food and eating?: No  Bathing or showering? No  Getting dressed: No  Getting to the toilet? No  Using the toilet:No  Moving around from place to place: No  In the past year have you fallen or had a near fall?:No  Are you sexually active? No  Do you have more than one partner? No   Hearing Difficulties: No  Do you often ask people to speak up or repeat themselves? sometimes Do you experience ringing or noises in your ears?occasionally Do you have difficulty understanding soft or whispered voices? sometimes  Do you feel that you have a problem with memory? sometimes Do you often misplace items? No    Home Safety:  Do you have a smoke alarm at your residence? Yes at motel Do  you have grab bars in the bathroom?yes    Cognitive Testing  Alert? Yes Normal Appearance?Yes  Oriented to person? Yes Place? Yes  Time? Yes  Recall of three objects? Yes  Can perform simple calculations? Yes  Displays appropriate judgment?Yes  Can read the correct time from a watch face?Yes   List the Names of Other Physician/Practitioners you currently use:  See referral list for the physicians patient is currently seeing. Appt with  CardiologistNov 5th    Review of Systems:   Objective:     General appearance: Appears stated age and mildly obese  Head: Normocephalic, without obvious abnormality, atraumatic  Eyes: conj clear, EOMi PEERLA  Ears: normal TM's and external ear canals both ears  Nose: Nares normal. Septum midline. Mucosa normal. No drainage or sinus tenderness.  Throat: lips, mucosa, and tongue normal; teeth and gums normal  Neck: no adenopathy, no carotid bruit, no JVD, supple, symmetrical, trachea midline and thyroid not enlarged, symmetric, no tenderness/mass/nodules  No CVA tenderness.  Lungs: clear to auscultation bilaterally  Breasts: normal appearance, no masses or tenderness,   Heart: regular rate and rhythm, S1, S2 normal, no murmur, click, rub or gallop  Abdomen: soft, non-tender; bowel sounds normal; large periumbilibal hernia Musculoskeletal: ROM normal in all joints, no crepitus, no deformity, Normal muscle strengthen. Back  is symmetric, no curvature. Skin: Skin color, texture, turgor normal. No rashes or lesions except a few excoriations on back Lymph nodes: Cervical, supraclavicular, and axillary nodes normal.  Neurologic: CN 2 -12 Normal, Normal symmetric reflexes. Normal coordination and gait  Psych: Alert & Oriented x 3, Mood appear stable.    Assessment:    Annual wellness medicare exam   Plan:    During the course of the visit the patient was educated and counseled about appropriate screening and preventive services including:  Mammogram and dental.       Patient Instructions (the written plan) was given to the patient.  Medicare Attestation  I have personally reviewed:  The patient's medical and social history  Their use of alcohol, tobacco or illicit drugs  Their current medications and supplements  The patient's functional ability including ADLs,fall risks, home safety risks, cognitive, and hearing and visual impairment  Diet and physical activities  Evidence for depression or mood disorders  The patient's weight, height, BMI, and visual acuity have been recorded in the chart. I have made referrals, counseling, and provided education to the patient based on review of  the above and I have provided the patient with a written personalized care plan for preventive services.

## 2012-06-01 NOTE — Patient Instructions (Addendum)
See cardiologist for followup on atrial fibrillation. Continue same medications. Take Cipro for urinary tract infection. Return in 6 months. Consider moving to assisted living facility

## 2012-06-03 ENCOUNTER — Other Ambulatory Visit: Payer: Self-pay

## 2012-06-03 ENCOUNTER — Other Ambulatory Visit (HOSPITAL_COMMUNITY)
Admission: RE | Admit: 2012-06-03 | Discharge: 2012-06-03 | Disposition: A | Discharge status: Home / Self Care | Payer: Medicare Other | Source: Ambulatory Visit | Attending: Internal Medicine | Admitting: Internal Medicine

## 2012-06-03 DIAGNOSIS — Z124 Encounter for screening for malignant neoplasm of cervix: Secondary | ICD-10-CM | POA: Insufficient documentation

## 2012-06-03 DIAGNOSIS — Z23 Encounter for immunization: Secondary | ICD-10-CM

## 2012-06-03 LAB — URINE CULTURE

## 2012-06-03 NOTE — Addendum Note (Signed)
Addended by: Judy Pimple on: 06/03/2012 09:11 AM   Modules accepted: Orders

## 2012-06-11 ENCOUNTER — Ambulatory Visit: Payer: Medicare Other | Admitting: Cardiovascular Disease

## 2012-06-20 ENCOUNTER — Telehealth: Payer: Self-pay

## 2012-06-20 NOTE — Telephone Encounter (Signed)
Patient feels that she has an infected tooth and is requesting an antibiotic. Does not have a dentist and doesn't feel like she wants to see one. Fever unknown. Has pain

## 2012-06-20 NOTE — Telephone Encounter (Signed)
Patient has requested this previously before EPIC. She has peridontal disease and must see dentist.

## 2012-06-20 NOTE — Telephone Encounter (Signed)
Patient advised. Not very happy with this news.

## 2012-06-24 ENCOUNTER — Telehealth: Payer: Self-pay | Admitting: Internal Medicine

## 2012-06-24 NOTE — Telephone Encounter (Signed)
I believe it will be Ok to have mild sedation.

## 2012-06-24 NOTE — Telephone Encounter (Signed)
Spoke w/Pam @ Dr. Keturah Barre office ([phone]).  Fax [phone].  Pam wanted the ok in writing.  Faxed the phone info over to Pam's attention.  They wanted Dr. Beryle Quant ok in writing for mild sedation.  Asked Pam is patient had someone to drive her home post surgery/sedation.  She advised patient is alone.  I advised that pt daughter lives out of town.  She has no one here to assist her and she lives in an Extended Stay Motel.  Pam advised that Dr. Bradly Chris MAY just do a consult today and if he does anything it would be just numbing.  She also indicated that Dr. Bradly Chris may inquire with Dr. Lenord Fellers as to medical clearance...she's not sure about that at this point.  Dr. Shan Levans office sent patient to the oral surgeon because she has some infection and several teeth that need to be extracted.  Dr. Tonye Royalty was unable to take x-rays today r/t patient's gag reflex.  Patient couldn't sit up or stand up straight enough.  Dr. Bradly Chris was to be asked to do a CT and then plan to extract several teeth per Ribondo's office.

## 2012-06-26 ENCOUNTER — Ambulatory Visit (INDEPENDENT_AMBULATORY_CARE_PROVIDER_SITE_OTHER): Payer: Medicare Other | Admitting: Cardiovascular Disease

## 2012-06-26 ENCOUNTER — Ambulatory Visit: Payer: Medicare Other | Admitting: Cardiovascular Disease

## 2012-06-26 VITALS — BP 144/56 | HR 51 | Wt 140.0 lb

## 2012-06-26 DIAGNOSIS — I480 Paroxysmal atrial fibrillation: Secondary | ICD-10-CM

## 2012-06-26 DIAGNOSIS — I10 Essential (primary) hypertension: Secondary | ICD-10-CM

## 2012-06-26 DIAGNOSIS — R609 Edema, unspecified: Secondary | ICD-10-CM

## 2012-06-26 DIAGNOSIS — I4891 Unspecified atrial fibrillation: Secondary | ICD-10-CM

## 2012-06-26 MED ORDER — DILTIAZEM HCL ER COATED BEADS 240 MG PO CP24: 240.0000 mg | ORAL_CAPSULE | Freq: Every day | ORAL | Status: DC

## 2012-06-26 NOTE — Progress Notes (Signed)
Patient ID: Selena Price, female   DOB: 1923-06-08, 78 y.o.   MRN: [id number] 78 yo referred by Dr Baird Kay for PAF  Had palpitations and seen in ER 10/15 with rapid afib  Converted with cardizem drip and D/C home.  Has issues with compliance with meds.  Chronic LE edema but doesn't take diuretics as needed. Labs in ER normal including HCT, TSH and CR.  History of HTN on multiple meds Still her life revolves around being evicted form her home and living in a motel.  Very bitter about situation.  At time of afib and ER visit was dealing with another neighborhood petition. No further palpitations. No dyspnea and no chest pains.  Had stable hemodynamics in ER even in rapid afib  ROS: Denies fever, malais, weight loss, blurry vision, decreased visual acuity, cough, sputum, SOB, hemoptysis, pleuritic pain, palpitaitons, heartburn, abdominal pain, melena, lower extremity edema, claudication, or rash.  All other systems reviewed and negative   General: Affect appropriate Frail elderly female HEENT: normal Neck supple with no adenopathy JVP normal no bruits no thyromegaly Lungs clear with no wheezing and good diaphragmatic motion Heart:  S1/S2 no murmur,rub, gallop or click PMI normal Abdomen: benighn, BS positve, no tenderness, no AAA no bruit.  No HSM or HJR Distal pulses intact with no bruits Plus two bilateral edema Neuro non-focal Skin warm and dry No muscular weakness  Medications Current Outpatient Prescriptions  Medication Sig Dispense Refill  . acetaminophen (TYLENOL) 500 MG tablet Take 500 mg by mouth every 6 (six) hours as needed. For pain      . atenolol (TENORMIN) 25 MG tablet Take 1 tablet (25 mg total) by mouth daily.  30 tablet  11  . esomeprazole (NEXIUM) 40 MG capsule Take 1 capsule (40 mg total) by mouth daily before breakfast.  30 capsule  11  . NIFEdipine (PROCARDIA-XL/ADALAT CC) 60 MG 24 hr tablet Take 1 tablet (60 mg total) by mouth daily.  60 tablet  11  . oxybutynin  (DITROPAN) 5 MG tablet Take 1 tablet (5 mg total) by mouth 2 (two) times daily before a meal.  60 tablet  11  . ramipril (ALTACE) 10 MG capsule Take 1 capsule (10 mg total) by mouth daily.  30 capsule  11  . torsemide (DEMADEX) 20 MG tablet Take 20 mg by mouth as needed. One to three tabs q am prn        Allergies Review of patient's allergies indicates no known allergies.  Family History: Family History  Problem Relation Age of Onset  . Heart disease Mother   . Cancer Father     Social History: History   Social History  . Marital Status: Widowed    Spouse Name: N/A    Number of Children: N/A  . Years of Education: N/A   Occupational History  . Not on file.   Social History Main Topics  . Smoking status: Never Smoker   . Smokeless tobacco: Never Used  . Alcohol Use: No  . Drug Use: No  . Sexually Active:    Other Topics Concern  . Not on file   Social History Narrative  . No narrative on file    Electrocardiogram: 10/15  First ECG rapid afib rate 145 with ST/T wave changes  F/U post conversion is normal rate 57  Assessment and Plan

## 2012-06-26 NOTE — Assessment & Plan Note (Signed)
Well controlled.  Continue current medications and low sodium Dash type diet.    

## 2012-06-26 NOTE — Assessment & Plan Note (Signed)
Encouraged her to take her demedex daily and limit salt

## 2012-06-26 NOTE — Patient Instructions (Addendum)
Your physician recommends that you schedule a follow-up appointment in:   AS NEEDED  Your physician has recommended you make the following change in your medication:  TAKE TORSEMIDE 20 MG EVERY DAY   STOP NIFEDIPINE START  DILTIAZEM  CD  240 MG EVERY DAY

## 2012-06-26 NOTE — Assessment & Plan Note (Signed)
No recurrent palpitations.  Given advanced age and frailty would not recommend coumadin.  Change procardia to cardizem as her rate was still high in afib despite beta blocker.

## 2012-06-28 ENCOUNTER — Telehealth: Payer: Self-pay | Admitting: Cardiovascular Disease

## 2012-06-28 NOTE — Telephone Encounter (Signed)
Spoke to patient she wanted to know about new medications that was prescribed 06/27/12.Patient was told to stop nifedipine and start diltiazem 240 mg daily.Start taking torsemide 20 mg every day.Take antibiotic as prescribed by oral surgeon.Patient had questions about a tooth that needs pulling advised to call oral surgeon for those questions.Patient understood about medication changes 06/27/12.

## 2012-06-28 NOTE — Telephone Encounter (Signed)
Pt calling re medication question, was put on new med yesterday and was already on an abx, is she to take both?

## 2012-07-25 ENCOUNTER — Telehealth: Payer: Self-pay | Admitting: Cardiovascular Disease

## 2012-07-25 NOTE — Telephone Encounter (Signed)
Pt called, she started taken Diltiazem 240 mg on 06/26/12, since then she  has had two episodes of tachycardia; the first episode was a week ago; It startes one hour after taken the medication it lasted about an hour. The second episode was two days ago it started about 4:00 AM, then she took the Diltiazem medication in a few hours the rate went down. Patient would like to know if she needs a stronger medication. Pt was made aware that her body may  needs to adjust to the medication; I will send message to MD and his nurse. Pt. verbalized understanding.

## 2012-07-25 NOTE — Telephone Encounter (Signed)
plz return call to pt [phone] regarding medication questions.

## 2012-07-25 NOTE — Telephone Encounter (Signed)
Continue cardizem  

## 2012-07-26 NOTE — Telephone Encounter (Signed)
PT  AWARE./CY 

## 2013-01-14 ENCOUNTER — Telehealth: Payer: Self-pay | Admitting: Cardiovascular Disease

## 2013-01-14 NOTE — Telephone Encounter (Signed)
New problem  Pt states her hair has been falling out since she started taking dilitiazem.  She wants to know if this is a side effect for this medication?

## 2013-01-14 NOTE — Telephone Encounter (Signed)
Returned call to patient she stated her hair is falling out.Stated she has noticed for the past 2 1/2 months.Stated has noticed since she has been taking diltiazem.Also wants to know if ok to take aspirin as needed.Patient was told Dr.Nishan out of office this week will send message to him for advice.

## 2013-01-20 NOTE — Telephone Encounter (Signed)
Can stop cardizem if she wants to see if hair gets better

## 2013-01-20 NOTE — Telephone Encounter (Signed)
Patient called no answer.Unable to leave a voice mail.

## 2013-01-21 NOTE — Telephone Encounter (Signed)
PT AWARE OF RECOMMENDATIONS ./CY 

## 2013-06-10 ENCOUNTER — Other Ambulatory Visit: Payer: Self-pay | Admitting: Internal Medicine

## 2013-06-12 ENCOUNTER — Telehealth: Payer: Self-pay | Admitting: *Deleted

## 2013-06-12 MED ORDER — DILTIAZEM HCL ER COATED BEADS 240 MG PO CP24: 240.0000 mg | ORAL_CAPSULE | Freq: Every day | ORAL | Status: DC

## 2013-06-12 NOTE — Telephone Encounter (Signed)
Confirmed with daughter patient is taking the diltiazem, reviewed because of note from June 2014, also informed it was time for patient to have a yearly appointment, will send in script for refill diltiazem.

## 2013-08-14 ENCOUNTER — Encounter: Payer: Self-pay | Admitting: Internal Medicine

## 2013-09-01 ENCOUNTER — Other Ambulatory Visit: Payer: Self-pay | Admitting: *Deleted

## 2013-09-01 MED ORDER — DILTIAZEM HCL ER COATED BEADS 240 MG PO CP24: 240.0000 mg | ORAL_CAPSULE | Freq: Every day | ORAL | Status: DC

## 2013-09-05 ENCOUNTER — Ambulatory Visit (INDEPENDENT_AMBULATORY_CARE_PROVIDER_SITE_OTHER): Payer: Medicare Other | Admitting: Internal Medicine

## 2013-09-05 ENCOUNTER — Encounter: Payer: Self-pay | Admitting: Internal Medicine

## 2013-09-05 VITALS — BP 154/70 | HR 60 | Temp 98.0°F | Ht <= 58 in | Wt 119.5 lb

## 2013-09-05 DIAGNOSIS — Z23 Encounter for immunization: Secondary | ICD-10-CM

## 2013-09-05 DIAGNOSIS — Z13220 Encounter for screening for lipoid disorders: Secondary | ICD-10-CM

## 2013-09-05 DIAGNOSIS — Z130 Encounter for screening for diseases of the blood and blood-forming organs and certain disorders involving the immune mechanism: Secondary | ICD-10-CM

## 2013-09-05 DIAGNOSIS — Z1329 Encounter for screening for other suspected endocrine disorder: Secondary | ICD-10-CM

## 2013-09-05 DIAGNOSIS — I10 Essential (primary) hypertension: Secondary | ICD-10-CM

## 2013-09-05 DIAGNOSIS — R634 Abnormal weight loss: Secondary | ICD-10-CM

## 2013-09-05 DIAGNOSIS — Z13228 Encounter for screening for other metabolic disorders: Secondary | ICD-10-CM

## 2013-09-05 LAB — CBC WITH DIFFERENTIAL/PLATELET
BASOS PCT: 0 % (ref 0–1)
Basophils Absolute: 0 10*3/uL (ref 0.0–0.1)
EOS ABS: 0.2 10*3/uL (ref 0.0–0.7)
Eosinophils Relative: 2 % (ref 0–5)
HCT: 36.6 % (ref 36.0–46.0)
Hemoglobin: 12 g/dL (ref 12.0–15.0)
Lymphocytes Relative: 17 % (ref 12–46)
Lymphs Abs: 1.7 10*3/uL (ref 0.7–4.0)
MCH: 29.5 pg (ref 26.0–34.0)
MCHC: 32.8 g/dL (ref 30.0–36.0)
MCV: 89.9 fL (ref 78.0–100.0)
Monocytes Absolute: 0.6 10*3/uL (ref 0.1–1.0)
Monocytes Relative: 7 % (ref 3–12)
NEUTROS PCT: 74 % (ref 43–77)
Neutro Abs: 7.1 10*3/uL (ref 1.7–7.7)
PLATELETS: 300 10*3/uL (ref 150–400)
RBC: 4.07 MIL/uL (ref 3.87–5.11)
RDW: 14.1 % (ref 11.5–15.5)
WBC: 9.6 10*3/uL (ref 4.0–10.5)

## 2013-09-05 MED ORDER — TETANUS-DIPHTH-ACELL PERTUSSIS 5-2.5-18.5 LF-MCG/0.5 IM SUSP: 0.5000 mL | Freq: Once | INTRAMUSCULAR | Status: DC

## 2013-09-05 NOTE — Progress Notes (Signed)
Subjective:    Patient ID: Selena Price, female    DOB: 07/11/1923, 80 y.o.   MRN: [id number]  HPI Not seen since 2013. Now 80 years old. Lives alone in an extended stay motel. Noncompliant with medications. Needs dental extractions but refuses.  Knows month, year president and can do serial sevens.  History of hypertension, paroxysmal atrial fibrillation dependent edema, GE reflux, hoarding behavior, osteoarthritis, recurrent urinary tract infections and vision loss in right eye. She has stress and urge urinary incontinence.  Past medical history: Basal cell carcinoma removed from her nose in 2009. Cholecystectomy and appendectomy in 1970. Benign breast biopsy 1959. Pyelonephritis 1998.  Infected sebaceous cyst: Oct 2008.  Social history: She is a widow. 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 One daughter lives in [place]. Husband deceased for several years. He was a professor at [employer]. She has a dysthymic mood. Difficulty getting organized to get things done which has been known for a number of years.  Family history:  Father died prostate cancer. Mother died ASCVD. No siblings.    Review of Systems  Constitutional: Negative for fever, chills and diaphoresis.  HENT:       History of vision loss right eye  Respiratory: Negative.   Cardiovascular: Negative for chest pain.  Gastrointestinal: Negative.   Genitourinary:       Stress and urge urinary incontinence. Stays in the bathroom and office for prolonged periods of time and we can never get a urine specimen. She will not bring [place] a fresh urine specimen either.  Neurological: Negative.   Hematological: Negative.   Psychiatric/Behavioral:       History of hoarding behavior       Objective:   Physical Exam  Vitals reviewed. Constitutional: She is oriented to person, place, and time. She appears well-developed and well-nourished. No distress.  HENT:  Head: Normocephalic and atraumatic.    Right Ear: External ear normal.  Left Ear: External ear normal.  Mouth/Throat: Oropharynx is clear and moist. No oropharyngeal exudate.  Eyes: Conjunctivae and EOM are normal. Pupils are equal, round, and reactive to light. Right eye exhibits no discharge. Left eye exhibits no discharge. No scleral icterus.  Neck: Neck supple. No JVD present. No thyromegaly present.  Cardiovascular: Normal rate and regular rhythm.   No murmur heard. Absent pulses in feet  Pulmonary/Chest: Effort normal and breath sounds normal. She has no wheezes.  Breasts normal female  Abdominal: Soft. Bowel sounds are normal. She exhibits no distension and no mass. There is no rebound and no guarding.  Large umbilical hernia  Genitourinary:  deferred  Musculoskeletal:  Dependent edema right leg  Lymphadenopathy:    She has no cervical adenopathy.  Neurological: She is alert and oriented to person, place, and time. She has normal reflexes. She displays normal reflexes. No cranial nerve deficit.  Ambulates with cane and walker  Skin: Skin is warm and dry. Rash noted. She is not diaphoretic.  Seborrhea face and intertrigo  Psychiatric: She has a normal mood and affect. Thought content normal.          Assessment & Plan:  HTN  History of hoarding behavior  Weight loss-19 pounds since 2013. I think this is due to poor eating habits and living alone in a motel  History of GE reflux  History of recurrent urinary infection  History of vision loss right eye  Intertrigo  History of dependent edema  Plan: Patient may very well  need driving evaluation. Her situation living alone in an extended stay motel may not be very good in the long run. She may need to consider a retirement facility near [place] in [place]. Right now she's not willing to consider that.  Return in 6 months.    Subjective:   Patient presents for Medicare Annual/Subsequent preventive examination.   Review Past  Medical/Family/Social: see above   Risk Factors  Current exercise habits: sedentary Dietary issues discussed: nibbles- has weight loss  Cardiac risk factors: Hypertension, family history  Depression Screen  (Note: if answer to either of the following is "Yes", a more complete depression screening is indicated)   Over the past two weeks, have you felt down, depressed or hopeless? No -daughter thinks she is lonely Over the past two weeks, have you felt little interest or pleasure in doing things? No Have you lost interest or pleasure in daily life? No Do you often feel hopeless? No Do you cry easily over simple problems? No   Activities of Daily Living  In your present state of health, do you have any difficulty performing the following activities?:   Driving? Yes-daughter reports issues with driving Managing money? No  Feeding yourself? No  Getting from bed to chair? No -but ambulates slowly Climbing a flight of stairs? No  Preparing food and eating?: No  Bathing or showering? No  Getting dressed: No  Getting to the toilet? No  Using the toilet:No  Moving around from place to place: Ambulates slowly In the past year have you fallen or had a near fall?:No  Are you sexually active? No  Do you have more than one partner? No   Hearing Difficulties: No  Do you often ask people to speak up or repeat themselves? No  Do you experience ringing or noises in your ears? No  Do you have difficulty understanding soft or whispered voices? sometimes Do you feel that you have a problem with memory? No Do you often misplace items? No    Home Safety:  Do you have a smoke alarm at your residence? Yes Do you have grab bars in the bathroom? yes Do you have throw rugs in your house? no   Cognitive Testing  Alert? Yes Normal Appearance?Yes  Oriented to person? Yes Place? Yes  Time? Yes  Recall of three objects? Yes  Can perform simple calculations? Yes  Displays appropriate  judgment?Yes  Can read the correct time from a watch face?Yes   List the Names of Other Physician/Practitioners you currently use:  See referral list for the physicians patient is currently seeing.   Needs to have eye exam  Review of Systems: See above   Objective:     General appearance: Appears stated age  Head: Normocephalic, without obvious abnormality, atraumatic  Eyes: conj clear, EOMi PEERLA  Ears: normal TM's and external ear canals both ears  Nose: Nares normal. Septum midline. Mucosa normal. No drainage or sinus tenderness.  Throat: lips, mucosa, and tongue normal; teeth and gums normal  Neck: no adenopathy, no carotid bruit, no JVD, supple, symmetrical, trachea midline and thyroid not enlarged, symmetric, no tenderness/mass/nodules  No CVA tenderness.  Lungs: clear to auscultation bilaterally  Breasts: normal appearance, no masses or tenderness Heart: regular rate and rhythm, S1, S2 normal, no murmur, click, rub or gallop  Abdomen: soft, non-tender; bowel sounds normal; no masses, no organomegaly  Musculoskeletal: ROM normal in all joints, no crepitus, no deformity, Normal muscle strengthen. Back  is symmetric, no  curvature. Skin: Skin color, texture, turgor normal. No rashes or lesions  Lymph nodes: Cervical, supraclavicular, and axillary nodes normal.  Neurologic: CN 2 -12 Normal, Normal symmetric reflexes. Normal coordination and gait  Psych: Alert & Oriented x 3, Mood appear stable.    Assessment:    Annual wellness medicare exam   Plan:    During the course of the visit the patient was educated and counseled about appropriate screening and preventive services including:   Concerns about driving- needs eye exam 3 hemoccult cards given     Patient Instructions (the written plan) was given to the patient.  Medicare Attestation  I have personally reviewed:  The patient's medical and social history  Their use of alcohol, tobacco or illicit drugs  Their  current medications and supplements  The patient's functional ability including ADLs,fall risks, home safety risks, cognitive, and hearing and visual impairment  Diet and physical activities  Evidence for depression or mood disorders  The patient's weight, height, BMI, and visual acuity have been recorded in the chart. I have made referrals, counseling, and provided education to the patient based on review of the above and I have provided the patient with a written personalized care plan for preventive services.

## 2013-09-06 LAB — TSH: TSH: 2.117 u[IU]/mL (ref 0.350–4.500)

## 2013-09-06 LAB — LIPID PANEL
Cholesterol: 123 mg/dL (ref 0–200)
HDL: 39 mg/dL — ABNORMAL LOW (ref >39)
LDL CALC: 69 mg/dL (ref 0–99)
TRIGLYCERIDES: 75 mg/dL (ref <150)
Total CHOL/HDL Ratio: 3.2 Ratio
VLDL: 15 mg/dL (ref 0–40)

## 2013-09-06 LAB — COMPREHENSIVE METABOLIC PANEL
ALK PHOS: 68 U/L (ref 39–117)
ALT: 8 U/L (ref 0–35)
AST: 13 U/L (ref 0–37)
Albumin: 3.7 g/dL (ref 3.5–5.2)
BILIRUBIN TOTAL: 0.4 mg/dL (ref 0.2–1.2)
BUN: 18 mg/dL (ref 6–23)
CO2: 29 mEq/L (ref 19–32)
Calcium: 8.9 mg/dL (ref 8.4–10.5)
Chloride: 103 mEq/L (ref 96–112)
Creat: 0.97 mg/dL (ref 0.50–1.10)
Glucose, Bld: 88 mg/dL (ref 70–99)
Potassium: 4.1 mEq/L (ref 3.5–5.3)
SODIUM: 140 meq/L (ref 135–145)
TOTAL PROTEIN: 6.8 g/dL (ref 6.0–8.3)

## 2013-09-06 LAB — PREALBUMIN: PREALBUMIN: 17 mg/dL (ref 17.0–34.0)

## 2013-09-06 LAB — VITAMIN D 25 HYDROXY (VIT D DEFICIENCY, FRACTURES): VIT D 25 HYDROXY: 13 ng/mL — AB (ref 30–89)

## 2013-09-12 IMAGING — CR DG CHEST 1V PORT
1 series · 1 of 1 positions shown · non-contrast
Comparison: .None.

CLINICAL DATA: Irregular heart beat

PORTABLE CHEST - 1 VIEW

[AP]
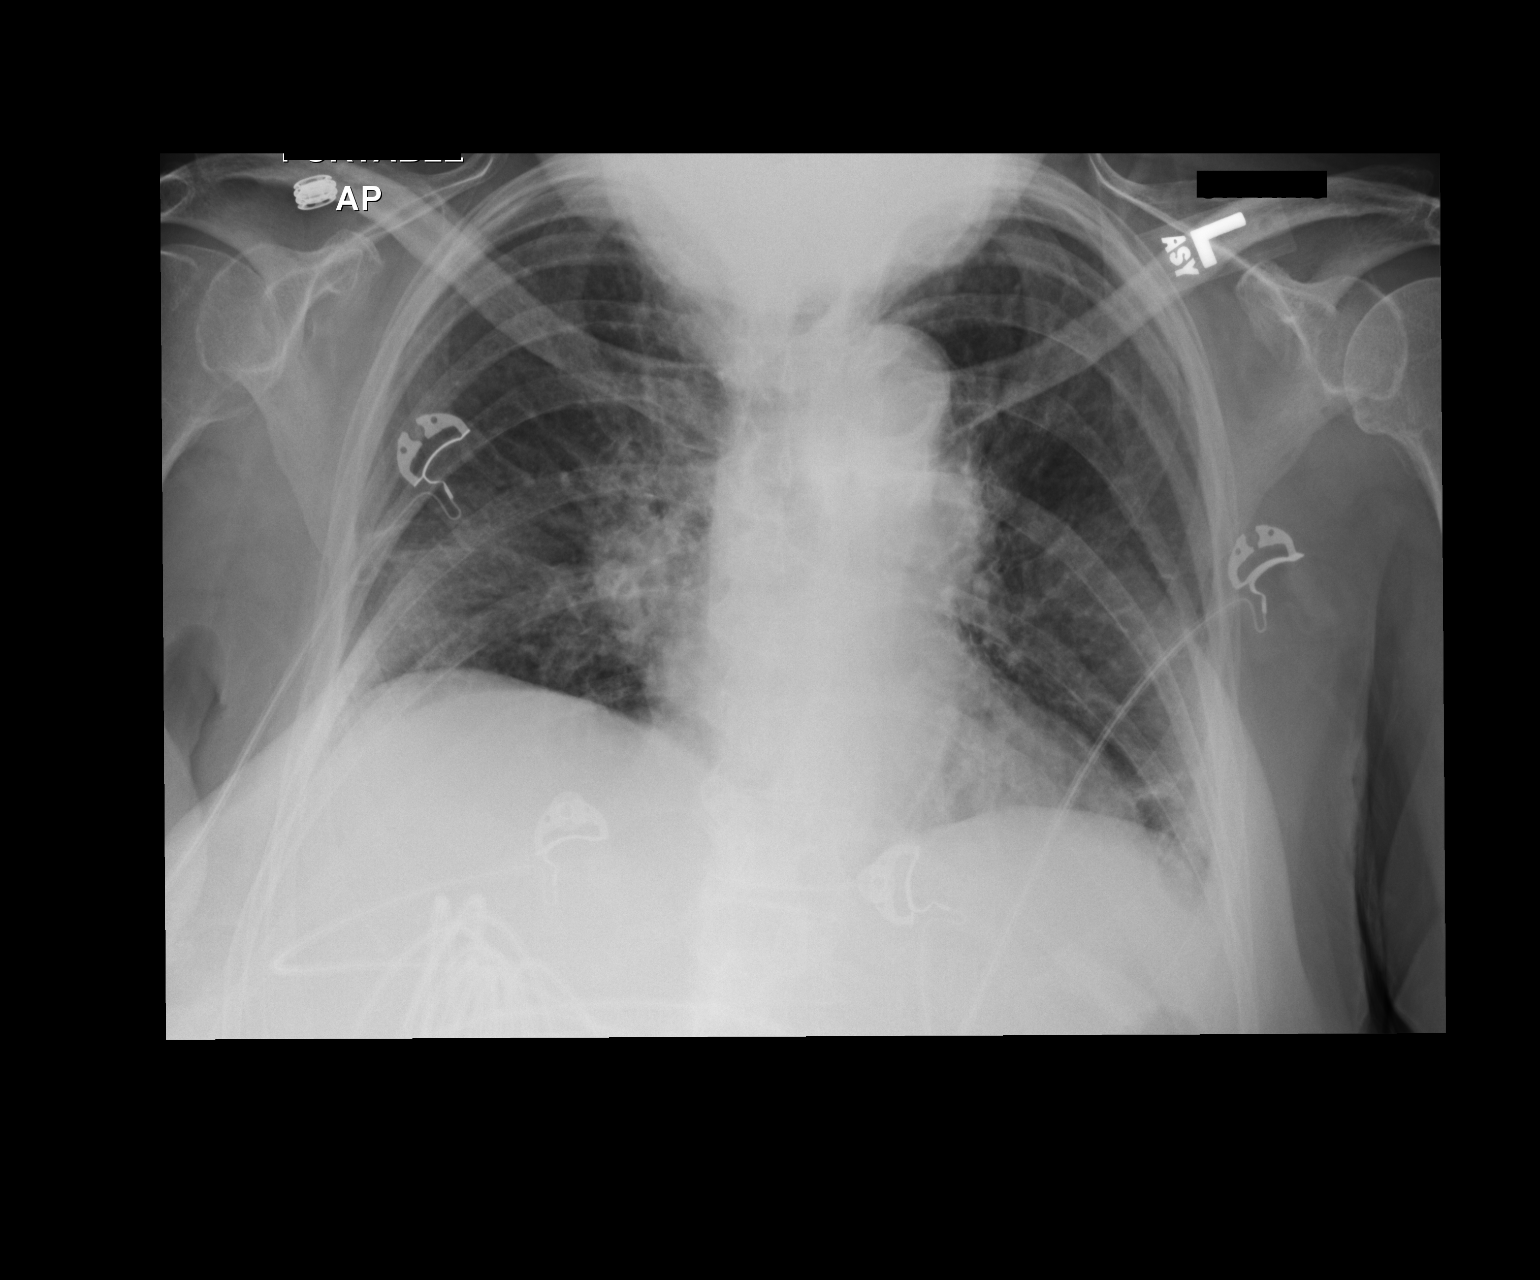

[1 of 1 positions shown; findings below may reference images not displayed]

FINDINGS: Poor inspiratory exam.

Pulmonary vascular congestion most notable centrally.

Increased markings peripheral aspect right mid to lower lung zone
and left lung base may represent atelectatic changes rather than
infiltrate and can be assessed on follow-up.

Cardiomegaly.

Calcified mildly tortuous aorta.
IMPRESSION: Cardiomegaly with pulmonary vascular prominence most notable
centrally.

Atelectatic changes right mid to lower lung zone left base
suspected.  This can be assessed on follow-up if there are any
progressive symptoms.

## 2013-09-22 ENCOUNTER — Telehealth: Payer: Self-pay | Admitting: Internal Medicine

## 2013-09-23 NOTE — Telephone Encounter (Signed)
I thought we were refilling all of her meds.

## 2013-09-24 ENCOUNTER — Other Ambulatory Visit: Payer: Self-pay

## 2013-09-24 MED ORDER — DILTIAZEM HCL ER COATED BEADS 240 MG PO CP24: 240.0000 mg | ORAL_CAPSULE | Freq: Every day | ORAL | Status: DC

## 2013-09-24 MED ORDER — TORSEMIDE 20 MG PO TABS: 20.0000 mg | ORAL_TABLET | ORAL | Status: AC | PRN

## 2013-09-24 MED ORDER — RAMIPRIL 10 MG PO CAPS: 10.0000 mg | ORAL_CAPSULE | Freq: Every day | ORAL | Status: DC

## 2013-09-24 MED ORDER — ATENOLOL 25 MG PO TABS: 25.0000 mg | ORAL_TABLET | Freq: Every day | ORAL | Status: DC

## 2013-11-07 ENCOUNTER — Other Ambulatory Visit: Payer: Self-pay | Admitting: Internal Medicine

## 2014-02-26 NOTE — Patient Instructions (Signed)
Return in 6 months.    Continue same medications.

## 2014-08-04 ENCOUNTER — Telehealth: Payer: Self-pay | Admitting: Internal Medicine

## 2014-08-04 MED ORDER — PENICILLIN V POTASSIUM 500 MG PO TABS: 500.0000 mg | ORAL_TABLET | Freq: Four times a day (QID) | ORAL | Status: DC

## 2014-08-04 NOTE — Telephone Encounter (Signed)
Called complaining of dental pain. Has not seen dentist in 2 years. Difficult to reason with her. Will contact daughter. Call in Pen Vee K 500 mg (#40) x 10 days with no refill.

## 2014-08-04 NOTE — Telephone Encounter (Addendum)
Patient called; states she has "infection in her jaw".  Hasn't seen a dentist in 2 years.  She believes it was Dr. Shea EvansBelcher when she had an issue about 2 years ago.  Spoke with Dr. Lenord FellersBaxley; because it's the holiday, she'll prescribe an antibiotic for patient.  Pharmacy:  [facility]([place]).  Deliver to:  [address], [address], Room [phone].  She'll need her medications delivered to her.  Dr. Lenord FellersBaxley [facility] Pen VeeK 500mg  #40.  Take 1 four times a day for dental infection.  Also will contact daughter, Prince Solianlizabeth Thompson to advise of this and that patient needs to be taken to the dentist and have these abcesses handled by the dentist in the future.    Daughter, Prince Solianlizabeth Thompson was contacted at [phone].  Spoke in detail with daughter who was very close minded to speaking with Mother about seeing a dentist.  Advised that Mother used to have a dentist but he refused to see her any longer because patient was non compliant.  Daughter went on to say she doesn't know what to do with her mother or how to help since she is non compliant with practically everyone.  Advised that Dr. Lenord FellersBaxley is NOT going to continue to call in antibiotics for an issue that is not considered to be a medical issue.  This is likely an abcess and is a dental issue.  Patient MUST find a dentist to be treated for this condition in the future.  Dr. Lenord FellersBaxley will not provide antibiotics in the future for a dental issue.  Patient will need to be seen.    Gave daughter references to contact such as [employer] to see if they could provide suggestions to assist with care and advice for assisting with decision making process for Mother.

## 2014-10-19 ENCOUNTER — Other Ambulatory Visit: Payer: Self-pay | Admitting: Internal Medicine

## 2014-10-19 ENCOUNTER — Other Ambulatory Visit: Payer: Self-pay | Admitting: *Deleted

## 2014-10-19 MED ORDER — RAMIPRIL 10 MG PO CAPS: 10.0000 mg | ORAL_CAPSULE | Freq: Every day | ORAL | Status: DC

## 2014-10-19 MED ORDER — DILTIAZEM HCL ER COATED BEADS 240 MG PO CP24: 240.0000 mg | ORAL_CAPSULE | Freq: Every day | ORAL | Status: DC

## 2014-10-19 MED ORDER — ATENOLOL 25 MG PO TABS
25.0000 mg | ORAL_TABLET | Freq: Every day | ORAL | Status: DC
Start: 2014-10-19 — End: 2014-12-22

## 2014-10-19 NOTE — Telephone Encounter (Signed)
Pharmacy called requesting refills on Diltiazem, ramipril and atenolol.1 refill  Authorized patient needs appt

## 2014-11-27 ENCOUNTER — Encounter: Payer: Medicare Other | Admitting: Internal Medicine

## 2014-12-01 ENCOUNTER — Other Ambulatory Visit: Payer: Self-pay | Admitting: Internal Medicine

## 2014-12-01 ENCOUNTER — Ambulatory Visit (INDEPENDENT_AMBULATORY_CARE_PROVIDER_SITE_OTHER): Payer: Medicare Other | Admitting: Internal Medicine

## 2014-12-01 ENCOUNTER — Encounter: Payer: Self-pay | Admitting: Internal Medicine

## 2014-12-01 VITALS — BP 130/70 | HR 78 | Temp 98.5°F | Ht <= 58 in | Wt 102.5 lb

## 2014-12-01 DIAGNOSIS — E785 Hyperlipidemia, unspecified: Secondary | ICD-10-CM | POA: Diagnosis not present

## 2014-12-01 DIAGNOSIS — M170 Bilateral primary osteoarthritis of knee: Secondary | ICD-10-CM

## 2014-12-01 DIAGNOSIS — R634 Abnormal weight loss: Secondary | ICD-10-CM

## 2014-12-01 DIAGNOSIS — L304 Erythema intertrigo: Secondary | ICD-10-CM

## 2014-12-01 DIAGNOSIS — Z79899 Other long term (current) drug therapy: Secondary | ICD-10-CM | POA: Diagnosis not present

## 2014-12-01 DIAGNOSIS — K089 Disorder of teeth and supporting structures, unspecified: Secondary | ICD-10-CM

## 2014-12-01 DIAGNOSIS — I10 Essential (primary) hypertension: Secondary | ICD-10-CM

## 2014-12-01 DIAGNOSIS — E559 Vitamin D deficiency, unspecified: Secondary | ICD-10-CM

## 2014-12-01 DIAGNOSIS — Z0000 Encounter for general adult medical examination without abnormal findings: Secondary | ICD-10-CM

## 2014-12-01 DIAGNOSIS — Z23 Encounter for immunization: Secondary | ICD-10-CM | POA: Diagnosis not present

## 2014-12-01 DIAGNOSIS — E639 Nutritional deficiency, unspecified: Secondary | ICD-10-CM

## 2014-12-01 DIAGNOSIS — N390 Urinary tract infection, site not specified: Secondary | ICD-10-CM

## 2014-12-01 DIAGNOSIS — M4004 Postural kyphosis, thoracic region: Secondary | ICD-10-CM

## 2014-12-01 DIAGNOSIS — R5383 Other fatigue: Secondary | ICD-10-CM

## 2014-12-01 DIAGNOSIS — F42 Obsessive-compulsive disorder: Secondary | ICD-10-CM

## 2014-12-01 DIAGNOSIS — K088 Other specified disorders of teeth and supporting structures: Secondary | ICD-10-CM

## 2014-12-01 DIAGNOSIS — F423 Hoarding disorder: Secondary | ICD-10-CM

## 2014-12-01 LAB — COMPLETE METABOLIC PANEL WITH GFR
ALBUMIN: 3.4 g/dL — AB (ref 3.5–5.2)
ALK PHOS: 68 U/L (ref 39–117)
ALT: 8 U/L (ref 0–35)
AST: 13 U/L (ref 0–37)
BUN: 15 mg/dL (ref 6–23)
CHLORIDE: 102 meq/L (ref 96–112)
CO2: 23 meq/L (ref 19–32)
CREATININE: 0.92 mg/dL (ref 0.50–1.10)
Calcium: 9.2 mg/dL (ref 8.4–10.5)
GFR, EST NON AFRICAN AMERICAN: 55 mL/min — AB
GFR, Est African American: 63 mL/min
Glucose, Bld: 85 mg/dL (ref 70–99)
Potassium: 4.1 mEq/L (ref 3.5–5.3)
Sodium: 141 mEq/L (ref 135–145)
Total Bilirubin: 0.4 mg/dL (ref 0.2–1.2)
Total Protein: 6.9 g/dL (ref 6.0–8.3)

## 2014-12-01 LAB — LIPID PANEL
CHOLESTEROL: 134 mg/dL (ref 0–200)
HDL: 37 mg/dL — ABNORMAL LOW (ref >=46)
LDL Cholesterol: 76 mg/dL (ref 0–99)
Total CHOL/HDL Ratio: 3.6 Ratio
Triglycerides: 103 mg/dL (ref <150)
VLDL: 21 mg/dL (ref 0–40)

## 2014-12-01 LAB — CBC WITH DIFFERENTIAL/PLATELET
BASOS PCT: 0 % (ref 0–1)
Basophils Absolute: 0 10*3/uL (ref 0.0–0.1)
Eosinophils Absolute: 0.2 10*3/uL (ref 0.0–0.7)
Eosinophils Relative: 2 % (ref 0–5)
HCT: 37.1 % (ref 36.0–46.0)
Hemoglobin: 11.9 g/dL — ABNORMAL LOW (ref 12.0–15.0)
LYMPHS ABS: 1.4 10*3/uL (ref 0.7–4.0)
Lymphocytes Relative: 14 % (ref 12–46)
MCH: 29.2 pg (ref 26.0–34.0)
MCHC: 32.1 g/dL (ref 30.0–36.0)
MCV: 91.2 fL (ref 78.0–100.0)
MPV: 10.4 fL (ref 8.6–12.4)
Monocytes Absolute: 0.7 10*3/uL (ref 0.1–1.0)
Monocytes Relative: 7 % (ref 3–12)
NEUTROS ABS: 7.7 10*3/uL (ref 1.7–7.7)
NEUTROS PCT: 77 % (ref 43–77)
PLATELETS: 329 10*3/uL (ref 150–400)
RBC: 4.07 MIL/uL (ref 3.87–5.11)
RDW: 14 % (ref 11.5–15.5)
WBC: 10 10*3/uL (ref 4.0–10.5)

## 2014-12-01 LAB — TSH: TSH: 1.124 u[IU]/mL (ref 0.350–4.500)

## 2014-12-02 ENCOUNTER — Telehealth: Payer: Self-pay | Admitting: *Deleted

## 2014-12-02 LAB — VITAMIN D 25 HYDROXY (VIT D DEFICIENCY, FRACTURES): Vit D, 25-Hydroxy: 31 ng/mL (ref 30–100)

## 2014-12-02 NOTE — Telephone Encounter (Signed)
Spoke with patient and patient daughter reviewed lab results and diet instructions

## 2014-12-03 LAB — PREALBUMIN: Prealbumin: 15 mg/dL — ABNORMAL LOW (ref 17–34)

## 2014-12-04 ENCOUNTER — Encounter: Payer: Medicare Other | Admitting: Internal Medicine

## 2014-12-04 NOTE — Patient Instructions (Signed)
Continue same medications. It is my understanding [place] coming [place]. Please take good care of yourself. It has been a pleasure to take care of you. Please try to eat better.

## 2014-12-04 NOTE — Progress Notes (Signed)
Subjective:    Patient ID: Selena Price, female    DOB: [birth year], 81 y.o.   MRN: [id number]  HPI  81 year old White Female in today for health maintenance exam and evaluation of medical problems. Accompanied by her daughter. Daughter tells me patient will be moving to [facility] [place] this summer to live in an apartment near her where she can check in with her more frequently. She's currently living in an extended stay motel. She continues to drive despite concerns by her daughter and myself. She was given license for 5 years recently. She doesn't think patient will be driving when she moves [facility].  Patient has history of essential hypertension, osteoarthritis of knees, kyphosis, hoarding behavior, poor eating habits, poor dentition. Will not go to see dentist. In 2015 she weighed 119 1/2 pounds. Now weighs 102 1/2 pounds. Daughters try to alternate weekends checking on her in obtaining food for her. She eats in her room. She has microwave to cook frozen dinners etc. Doesn't sound like she eats all that well. Pre-albumin ordered.  History of episode of paroxysmal atrial fibrillation October 2015. Cardiologist saw her and did not recommend Coumadin. He recommended Cardizem and beta blocker. History of difficult to control blood pressure. Is on several drug regimen.  History of dependent edema, GE reflux, recurrent urinary infections. She has stress and urge urinary incontinence and wears disposable underwear.  Past medical history: Basal cell carcinoma removed from her nose in 2009. Cholecystectomy and appendectomy 1970. Benign breast biopsy 1959. Pyelonephritis 1998. Infected sebaceous cyst October 2008.  Social history: She is a widow. 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 Husband has been deceased for number of years. He was a professor at [employer]. Patient has a dysthymic mood. Has difficulty getting organized to get things done which is been an issue for  number of years. Hoarding behavior.  Family history: Father died of prostate cancer. Mother died of are tears carotic cardiovascular disease. No siblings.  History of vision loss right eye    Review of Systems  Constitutional:       She looks more frail than last year.  HENT: Positive for dental problem.   Eyes:       Vision loss right eye  Cardiovascular:       History of paroxysmal atrial fibrillation not thought to be a Coumadin candidate  Gastrointestinal: Negative.   Genitourinary:       History of stress and urge urinary incontinence. History of recurrent urinary infections.  Psychiatric/Behavioral:       History of hoarding behavior       Objective:   Physical Exam  Constitutional: She is oriented to person, place, and time. She appears well-developed and well-nourished. No distress.  HENT:  Head: Normocephalic and atraumatic.  Right Ear: External ear normal.  Left Ear: External ear normal.  Mouth/Throat: Oropharynx is clear and moist. No oropharyngeal exudate.  Eyes: Conjunctivae and EOM are normal. Pupils are equal, round, and reactive to light.  Neck: Neck supple. No JVD present. No tracheal deviation present.  Cardiovascular: Normal rate, regular rhythm and normal heart sounds.   No murmur heard. Pulmonary/Chest: Effort normal and breath sounds normal. She has no wheezes. She has no rales.  Breasts normal female. Has intertrigo under breasts  Abdominal: Soft. Bowel sounds are normal. She exhibits no distension and no mass. There is no tenderness. There is no rebound and no guarding.  Genitourinary:  Deferred  Musculoskeletal:  Trace  lower extremity edema  Neurological: She is alert and oriented to person, place, and time. She has normal reflexes. No cranial nerve deficit.  Moves slowly and needs assistance getting up on exam table  Skin: Skin is warm and dry. No rash noted. She is not diaphoretic.  Psychiatric: Her behavior is normal. Thought content normal.    Mood is somewhat dysthymic but she is cooperative  Vitals reviewed.         Assessment & Plan:  Weight loss-I think related to poor eating habits. Pre-albumin ordered and is low. Daughter informed. [place] where she should have her meals more readily available  Dependent edema  Essential hypertension  Paroxysmal atrial fibrillation not thought to be a Coumadin candidate  GE reflux  Hoarding behavior  Osteoarthritis of knees  Recurrent urinary infections  Vision loss right eye  Stress and urge urinary incontinence  Recurrent urinary tract infections  Plan: Patient moving to an apartment in [place] this coming summer to be closer to her daughters. We will be happy to transfer records to physician of her choice.  Subjective:   Patient presents for Medicare Annual/Subsequent preventive examination.  Review Past Medical/Family/Social:   Risk Factors  Current exercise habits: Sedentary Dietary issues discussed: Likely needs to eat more protein and more regularly  Cardiac risk factors: Hypertension and family history  Depression Screen  (Note: if answer to either of the following is "Yes", a more complete depression screening is indicated)   Over the past two weeks, have you felt down, depressed or hopeless? No  Over the past two weeks, have you felt little interest or pleasure in doing things? No Have you lost interest or pleasure in daily life? No Do you often feel hopeless? No Do you cry easily over simple problems? No   Activities of Daily Living  In your present state of health, do you have any difficulty performing the following activities?:   Driving? No -patient says no but daughter does not think patient drives well neither do I Managing money? No  Feeding yourself? No  Getting from bed to chair? No  Climbing a flight of stairs? yes Preparing food and eating?: No  Bathing or showering? No  Getting dressed: No   Getting to the toilet? No  Using the toilet:No  Moving around from place to place: No  In the past year have you fallen or had a near fall?:No  Are you sexually active? No  Do you have more than one partner? No   Hearing Difficulties: No  Do you often ask people to speak up or repeat themselves? No  Do you experience ringing or noises in your ears? No  Do you have difficulty understanding soft or whispered voices? No  Do you feel that you have a problem with memory?no Do you often misplace items? No    Home Safety:  Do you have a smoke alarm at your residence? Yes Do you have grab bars in the bathroom?yes Do you have throw rugs in your house? no   Cognitive Testing  Alert? Yes Normal Appearance?Yes  Oriented to person? Yes Place? Yes  Time? Yes  Recall of three objects? Yes  Can perform simple calculations? Yes  Displays appropriate judgment?Yes  Can read the correct time from a watch face?Yes   List the Names of Other Physician/Practitioners you currently use:  See referral list for the physicians patient is currently seeing.  No other physicians   Review of  Systems: See above  Objective:     General appearance: Appears stated age  Head: Normocephalic, without obvious abnormality, atraumatic  Eyes: conj clear, EOMi PEERLA  Ears: normal TM's and external ear canals both ears  Nose: Nares normal. Septum midline. Mucosa normal. No drainage or sinus tenderness.  Throat: lips, mucosa, and tongue normal; teeth and gums normal  Neck: no adenopathy, no carotid bruit, no JVD, supple, symmetrical, trachea midline and thyroid not enlarged, symmetric, no tenderness/mass/nodules  No CVA tenderness.  Lungs: clear to auscultation bilaterally  Breasts: normal appearance, no masses or tenderness Heart: regular rate and rhythm, S1, S2 normal, no murmur, click, rub or gallop  Abdomen: soft, non-tender; bowel sounds normal; no masses, no organomegaly  Musculoskeletal: ROM normal  in all joints, no crepitus, no deformity, Normal muscle strengthen. Back  is symmetric, no curvature. Skin: Skin color, texture, turgor normal. No rashes or lesions  Lymph nodes: Cervical, supraclavicular, and axillary nodes normal.  Neurologic: CN 2 -12 Normal, Normal symmetric reflexes. Normal coordination and gait  Psych: Alert & Oriented x 3, Mood appear stable.    Assessment:    Annual wellness medicare exam   Plan:    During the course of the visit the patient was educated and counseled about appropriate screening and preventive services including:   Improving eating habits  See dentist  Consider giving up driving license     Patient Instructions (the written plan) was given to the patient.  Medicare Attestation  I have personally reviewed:  The patient's medical and social history  Their use of alcohol, tobacco or illicit drugs  Their current medications and supplements  The patient's functional ability including ADLs,fall risks, home safety risks, cognitive, and hearing and visual impairment  Diet and physical activities  Evidence for depression or mood disorders  The patient's weight, height, BMI, and visual acuity have been recorded in the chart. I have made referrals, counseling, and provided education to the patient based on review of the above and I have provided the patient with a written personalized care plan for preventive services.

## 2014-12-22 ENCOUNTER — Other Ambulatory Visit: Payer: Self-pay | Admitting: Internal Medicine

## 2014-12-22 NOTE — Telephone Encounter (Signed)
Medications refilled

## 2015-03-30 ENCOUNTER — Emergency Department (HOSPITAL_COMMUNITY)
Admission: EM | Admit: 2015-03-30 | Discharge: 2015-03-30 | Disposition: A | Discharge status: Home / Self Care | Payer: Medicare Other | Attending: Emergency Medicine | Admitting: Emergency Medicine

## 2015-03-30 ENCOUNTER — Emergency Department (HOSPITAL_COMMUNITY): Payer: Medicare Other

## 2015-03-30 ENCOUNTER — Encounter (HOSPITAL_COMMUNITY): Payer: Self-pay | Admitting: *Deleted

## 2015-03-30 DIAGNOSIS — Y999 Unspecified external cause status: Secondary | ICD-10-CM | POA: Insufficient documentation

## 2015-03-30 DIAGNOSIS — S0081XA Abrasion of other part of head, initial encounter: Secondary | ICD-10-CM | POA: Insufficient documentation

## 2015-03-30 DIAGNOSIS — R011 Cardiac murmur, unspecified: Secondary | ICD-10-CM | POA: Diagnosis not present

## 2015-03-30 DIAGNOSIS — M1990 Unspecified osteoarthritis, unspecified site: Secondary | ICD-10-CM | POA: Insufficient documentation

## 2015-03-30 DIAGNOSIS — S0990XA Unspecified injury of head, initial encounter: Secondary | ICD-10-CM | POA: Diagnosis present

## 2015-03-30 DIAGNOSIS — L089 Local infection of the skin and subcutaneous tissue, unspecified: Secondary | ICD-10-CM | POA: Diagnosis not present

## 2015-03-30 DIAGNOSIS — S30810A Abrasion of lower back and pelvis, initial encounter: Secondary | ICD-10-CM | POA: Diagnosis not present

## 2015-03-30 DIAGNOSIS — W01198A Fall on same level from slipping, tripping and stumbling with subsequent striking against other object, initial encounter: Secondary | ICD-10-CM | POA: Insufficient documentation

## 2015-03-30 DIAGNOSIS — N3946 Mixed incontinence: Secondary | ICD-10-CM | POA: Insufficient documentation

## 2015-03-30 DIAGNOSIS — Y9301 Activity, walking, marching and hiking: Secondary | ICD-10-CM | POA: Insufficient documentation

## 2015-03-30 DIAGNOSIS — Y92481 Parking lot as the place of occurrence of the external cause: Secondary | ICD-10-CM | POA: Insufficient documentation

## 2015-03-30 DIAGNOSIS — S70211A Abrasion, right hip, initial encounter: Secondary | ICD-10-CM | POA: Diagnosis not present

## 2015-03-30 DIAGNOSIS — E669 Obesity, unspecified: Secondary | ICD-10-CM | POA: Diagnosis not present

## 2015-03-30 DIAGNOSIS — R001 Bradycardia, unspecified: Secondary | ICD-10-CM | POA: Diagnosis not present

## 2015-03-30 DIAGNOSIS — Z8669 Personal history of other diseases of the nervous system and sense organs: Secondary | ICD-10-CM | POA: Insufficient documentation

## 2015-03-30 DIAGNOSIS — S0011XA Contusion of right eyelid and periocular area, initial encounter: Secondary | ICD-10-CM | POA: Diagnosis not present

## 2015-03-30 DIAGNOSIS — Z7982 Long term (current) use of aspirin: Secondary | ICD-10-CM | POA: Diagnosis not present

## 2015-03-30 DIAGNOSIS — W19XXXA Unspecified fall, initial encounter: Secondary | ICD-10-CM

## 2015-03-30 DIAGNOSIS — I10 Essential (primary) hypertension: Secondary | ICD-10-CM | POA: Insufficient documentation

## 2015-03-30 MED ORDER — CEPHALEXIN 500 MG PO TABS: 500.0000 mg | ORAL_TABLET | Freq: Four times a day (QID) | ORAL | Status: AC

## 2015-03-30 NOTE — ED Notes (Signed)
MD at bedside. 

## 2015-03-30 NOTE — ED Provider Notes (Signed)
I saw and evaluated the patient, reviewed the resident's note and I agree with the findings and plan.  Fall likely secondary to faulty walker (missing a leg/wheel) with no other complications. Exam with slight ecchymosis and bruising around right eye without complications. Rest of PE likely baseline without evidence of further trauma. Ct head negative, will dc with w/ daughter.     Marily Memos, MD 04/02/15 [phone]

## 2015-03-30 NOTE — Discharge Instructions (Signed)
Please obtain and use a safe walker both in the home and outside. Fall prevention is very important for risk of severe fracture or bleeding.  Please take oral antibiotic as instructed for treatment of your skin infection. If there is no improvement after completing treatment contact your PCP as needed for alternate treatment.  Please arrange/establish with Cardiology as soon as able to with your upcoming move. Your medications for your heart rhythm may need dose adjustment and monitoring. In the meantime seek help sooner if you start having lightheadedness, weakness, or heart palpitations.

## 2015-03-30 NOTE — ED Notes (Signed)
Pt presents via [facility] after a fall going into her hair appt.  Pt states it was a slow fall with her walker and denies LOC, no obvious deformities.  Pt reports falling on her right side, small abrasions noted to right forehead, right eyebrow, right shoulder, right elbow.  Pt denies blood thinners but reports taking  ASA last night.  Pt denies pain.  Pt a x 4, NAD, able to stand and walk for EMS after fall.  Pt lives home alone and still drives.  [id number] P-[id number] R-[id number].

## 2015-03-30 NOTE — ED Provider Notes (Signed)
CSN: [id number]     Arrival date & time 03/30/15  1306 History   First MD Initiated Contact with Patient 03/30/15 1307     Chief Complaint  Patient presents with  . Fall   81 y/o woman with HTN, paroxysmal Afib controlled with atenolol and diltiazem, severe DJD presents to ED after falling with her walker in the parking lot when arriving at a hair appointment. She was walking less than 50 ft from her vehicle and states her walker fell over to the right on some loose/rough surface, after which she also fell to the right. She denies any dizziness, weakness, or loss of consciousness before during or after the fall. She felt moderate pain to the R side of her head and her R hip but no other complaints. She remained on the ground until transport at prompting of bystanders and EMS staff. She denies any vision changes, nausea, or worsening headache since falling. She has no chest pain or shortness of breath.  (Consider location/radiation/quality/duration/timing/severity/associated sxs/prior Treatment) Patient is a 81 y.o. female presenting with fall. The history is provided by the patient.  Fall This is a new problem. The current episode started today. The problem occurs rarely. The problem has been unchanged. Pertinent negatives include no chest pain, nausea, numbness, vertigo, visual change or vomiting.    Past Medical History  Diagnosis Date  . Hypertension   . Obesity   . Insomnia   . Arthritis     osteoarthritis  . Urinary incontinence, mixed   . Degenerative joint disease   . Peripheral edema   . Renal disorder    Past Surgical History  Procedure Laterality Date  . Breast surgery      biopsy l breast/ benign  . Appendectomy    . Cholecystectomy     Family History  Problem Relation Age of Onset  . Heart disease Mother   . Cancer Father    Social History  Substance Use Topics  . Smoking status: Never Smoker   . Smokeless tobacco: Never Used  . Alcohol Use: No   OB History     No data available     Review of Systems  Eyes: Negative for visual disturbance.  Respiratory: Negative for chest tightness and shortness of breath.   Cardiovascular: Positive for leg swelling. Negative for chest pain and palpitations.  Gastrointestinal: Negative for nausea and vomiting.  Skin: Positive for wound.  Neurological: Negative for dizziness, vertigo, tremors, syncope and numbness.  Hematological: Does not bruise/bleed easily.      Allergies  Review of patient's allergies indicates no known allergies.  Home Medications   Prior to Admission medications   Medication Sig Start Date End Date Taking? Authorizing Provider  acetaminophen (TYLENOL) 500 MG tablet Take 500 mg by mouth every 6 (six) hours as needed. For pain    Historical Provider, MD  aspirin 81 MG tablet Take 81 mg by mouth daily.    Historical Provider, MD  atenolol (TENORMIN) 25 MG tablet take 1 tablet by mouth daily 12/22/14   Margaree Mackintosh, MD  Cephalexin 500 MG tablet Take 1 tablet (500 mg total) by mouth 4 (four) times daily. 03/30/15   Fuller Plan, MD  diltiazem (CARDIZEM CD) 240 MG 24 hr capsule take 1 capsule by mouth daily 12/22/14   Margaree Mackintosh, MD  esomeprazole (NEXIUM) 40 MG capsule Take 1 capsule (40 mg total) by mouth daily before breakfast. 11/23/11   Margaree Mackintosh, MD  oxybutynin (DITROPAN) 5  MG tablet take 1 tablet by mouth twice a day    Margaree Mackintosh, MD  ramipril (ALTACE) 10 MG capsule take 1 capsule by mouth daily 12/22/14   Margaree Mackintosh, MD  torsemide (DEMADEX) 20 MG tablet Take 1 tablet (20 mg total) by mouth as needed. One to three tabs q am prn 09/24/13   Margaree Mackintosh, MD   BP 133/42 mmHg  Pulse 46  Temp(Src) 98.1 F (36.7 C) (Oral)  Resp 16  Wt 100 lb (45.36 kg)  SpO2 100%   Physical Exam  Constitutional: She is oriented to person, place, and time. No distress.  HENT:  Right Ear: External ear normal.  Left Ear: External ear normal.  Abrasion to R fronto-temporal region,  and contusion on lateral R orbit. No periorbital edema. Mildly TTP, no bleeding, no bony deformity.  Eyes: Conjunctivae and EOM are normal. Pupils are equal, round, and reactive to light.  Neck: Normal range of motion. Neck supple.  Advanced kyphosis of upper vertebrae  Cardiovascular:  Bradycardic, regular rhythm, Early systolic murmur 2/6 appreciated over LUSB, normal s1 s2  Pulmonary/Chest: Effort normal and breath sounds normal. She exhibits no tenderness.  Abdominal: Soft. She exhibits no distension. There is no tenderness.  Musculoskeletal: Normal range of motion. She exhibits edema.  Neurological: She is alert and oriented to person, place, and time. She has normal reflexes. No cranial nerve deficit.  Skin:  Abrasions on face and hip as described. 2 small abrasions on right side of trunk, no skin broken. Mild rash on L upper back with some small furuncles and excoriations.    ED Course  Procedures (including critical care time) Labs Review Labs Reviewed - No data to display  Imaging Review Ct Head Wo Contrast  03/30/2015   CLINICAL DATA:  Status post fall, hit concrete side 1  EXAM: CT HEAD WITHOUT CONTRAST  TECHNIQUE: Contiguous axial images were obtained from the base of the skull through the vertex without intravenous contrast.  COMPARISON:  None.  FINDINGS: No evidence of parenchymal hemorrhage or extra-axial fluid collection. No mass lesion, mass effect, or midline shift.  No CT evidence of acute infarction.  Subcortical white matter and periventricular small vessel ischemic changes.  Age related atrophy.  No ventriculomegaly.  The visualized paranasal sinuses are essentially clear. The mastoid air cells are unopacified.  No evidence of calvarial fracture.  IMPRESSION: No evidence of acute intracranial abnormality.  Age related atrophy with small vessel ischemic changes.   Electronically Signed   By: Charline Bills M.D.   On: 03/30/2015 15:35   Dg Hip Unilat With Pelvis 2-3  Views Right  03/30/2015   CLINICAL DATA:  Right hip pain after fall  EXAM: DG HIP (WITH OR WITHOUT PELVIS) 2-3V RIGHT  COMPARISON:  None.  FINDINGS: No fracture or dislocation is seen.  Left hip is internally rotated.  Mild degenerative changes of the bilateral hips.  Visualized bony pelvis appears intact.  Degenerative changes of the lower lumbar spine.  Vascular calcifications.  IMPRESSION: No fracture or dislocation is seen.   Electronically Signed   By: Charline Bills M.D.   On: 03/30/2015 15:29   I have personally reviewed and evaluated these images and lab results as part of my medical decision-making.   EKG Interpretation None      MDM   Final diagnoses:  Fall, initial encounter  Skin infection    80 y/o woman with standing height fall onto asphalt striking her head and R  hip. She has a history of paroxysmal Afib since 2013 and is controlled on beta blocker and calcium channel blocker. Bradycardic in 40s but regular, hemodynamically stable, with SBP 140s on physical exam at ED. She denies syncope or presyncope at any time preceding or after her fall. Fall is most likely related to her age and advanced DJD for which she has been previously evaluated by orthopedic surgery years ago but did not pursue arthroplasty. She is recommended to follow up with cardiology however states she plans to do so after completing her move to [facility] with family. On evaluation in room her walker was noticed to be missing the left anterior foot. She states this was missing since this morning. This contributed to her walker's instability when falling in a parking lot. She has another walker at home for use and will be travelling home with her daughter for assistance.  Head CT demonstrates no fracture or acute intracranial process. 3 view xray of R hip shows bilateral arthritis of the pelvis but no acute bony process of the right hip. She does have a 1st degree abrasion over the right frontotemporal  area.  She also has a rash on left upper back with numerous small furuncles, one larger with mild erythema. A course of PO antibiotics will be provided as treatment for carbuncles that have been resistant to her use of topical (neosporin) treatment for the last week.  Fuller Plan, MD 03/30/15 1628  Fuller Plan, MD 03/30/15 1639  Marily Memos, MD 04/02/15 [phone]

## 2015-10-15 ENCOUNTER — Encounter: Payer: Self-pay | Admitting: *Deleted

## 2016-07-21 IMAGING — DX DG HIP (WITH OR WITHOUT PELVIS) 2-3V*R*
3 series · 3 of 3 positions shown · non-contrast
Comparison: None.

CLINICAL DATA: Right hip pain after fall

EXAM:
DG HIP (WITH OR WITHOUT PELVIS) 2-3V RIGHT

[pelvis ap]
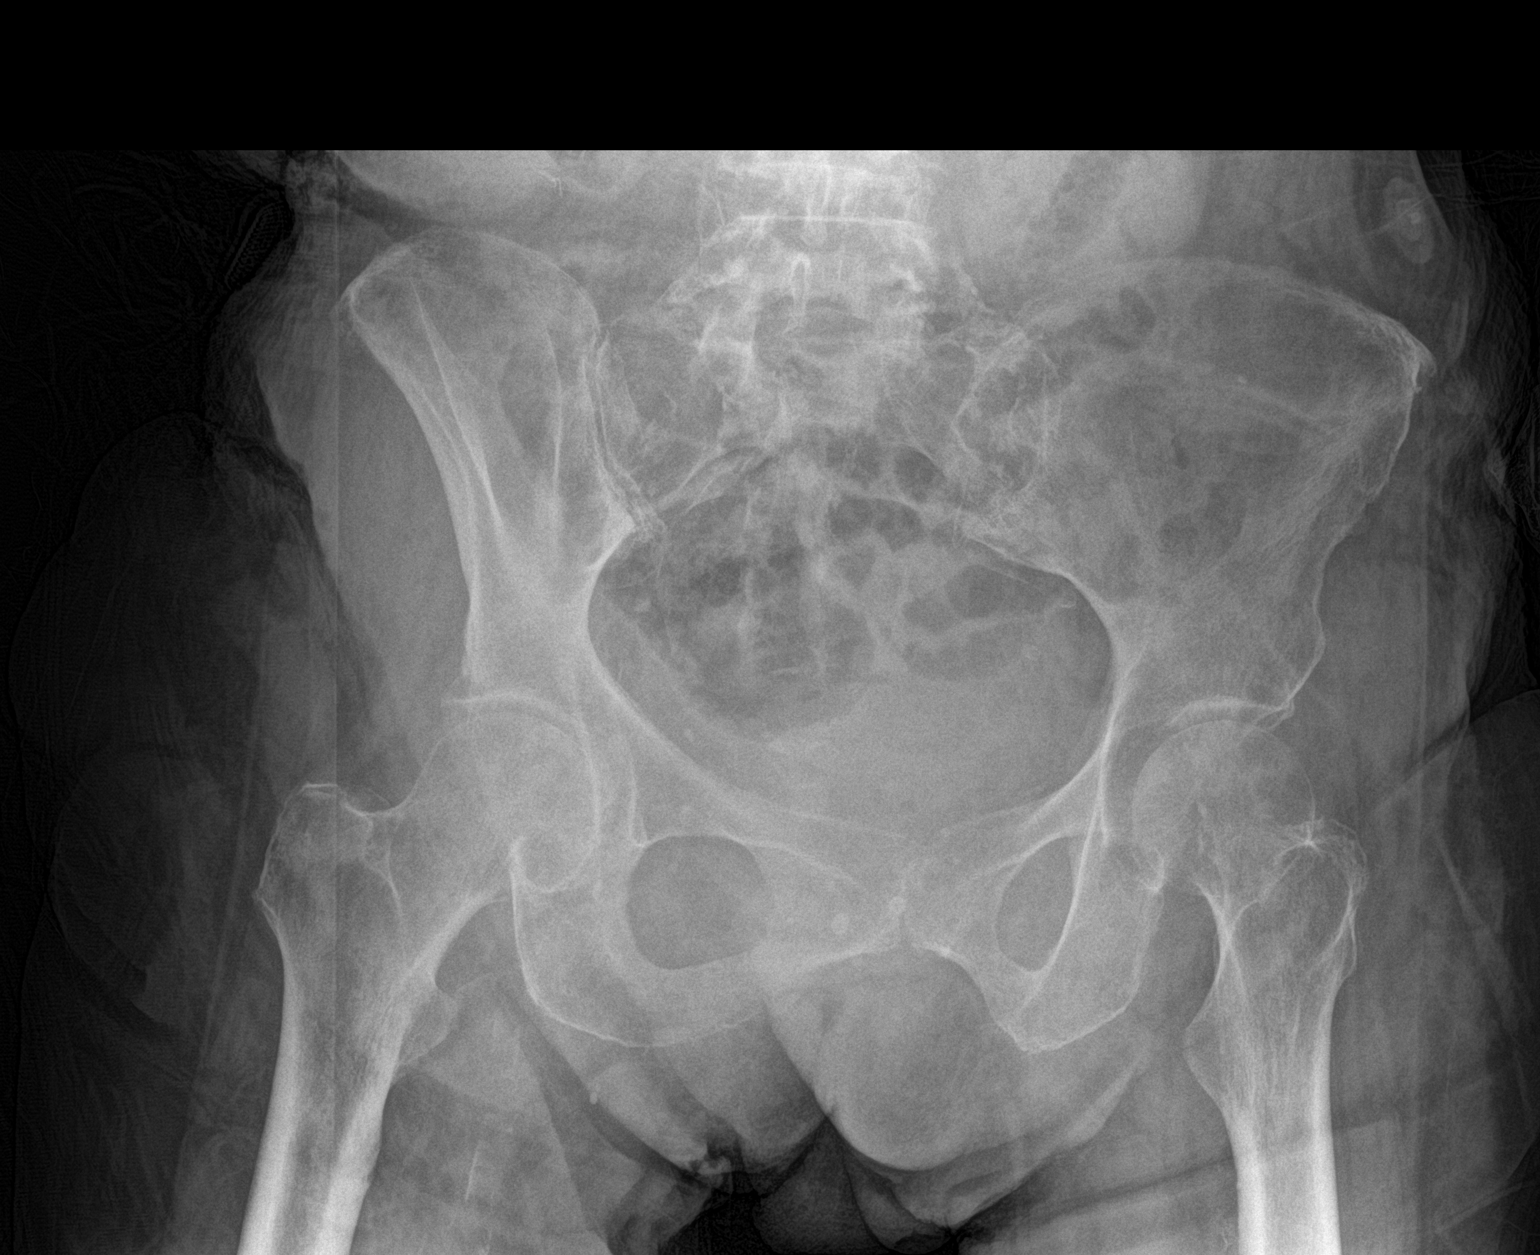

[hip ap]
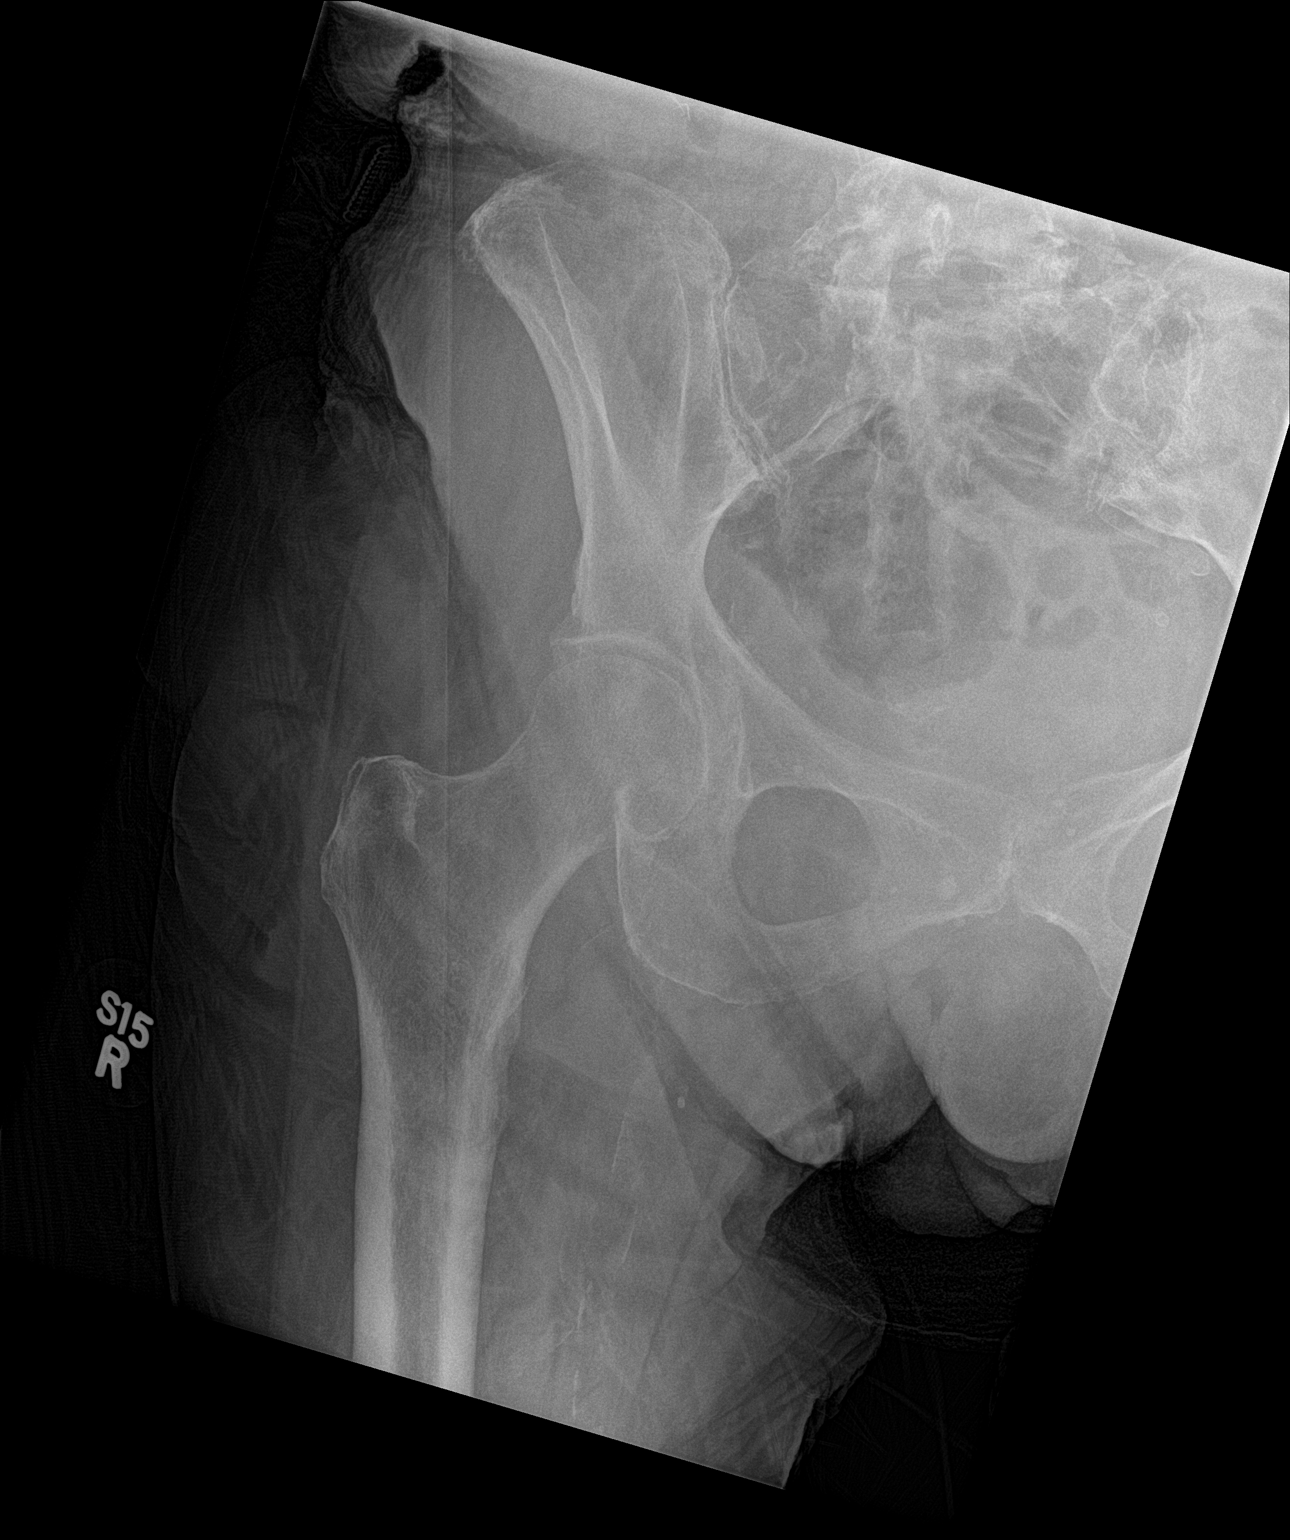

[hip lat]
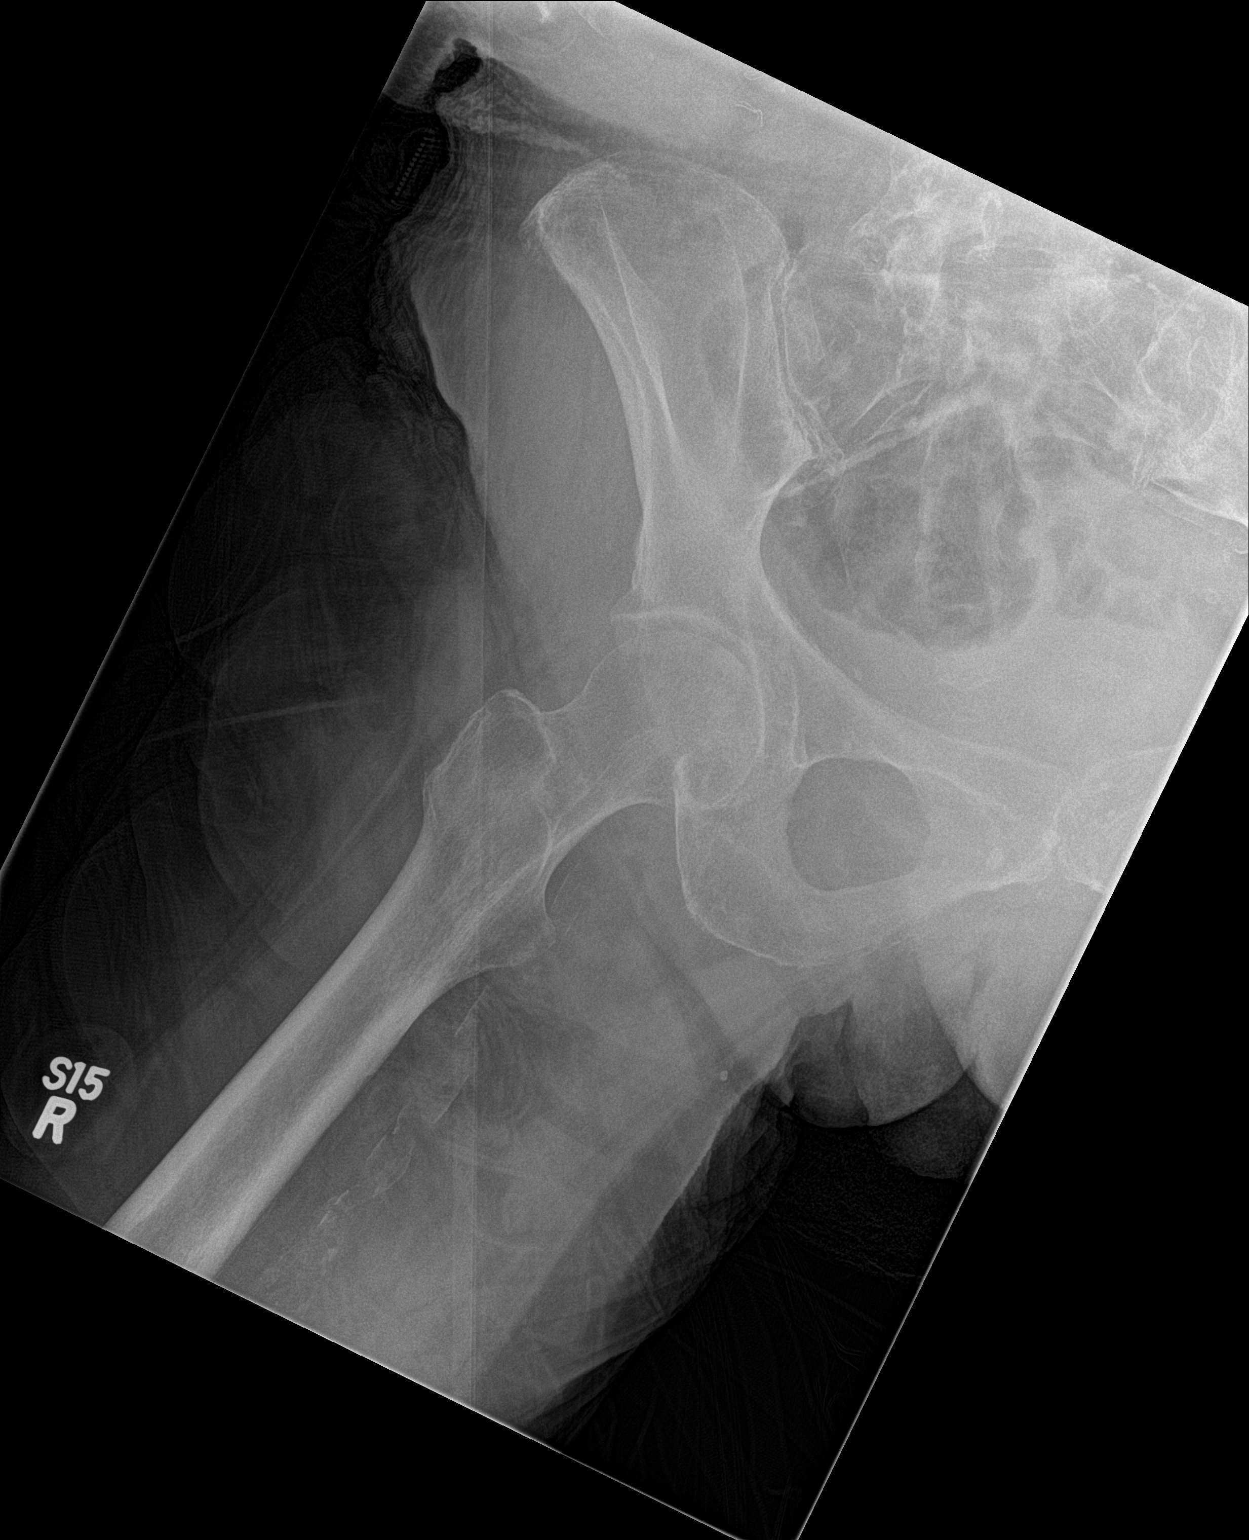

[3 of 3 positions shown; findings below may reference images not displayed]

FINDINGS: No fracture or dislocation is seen.

Left hip is internally rotated.

Mild degenerative changes of the bilateral hips.

Visualized bony pelvis appears intact.

Degenerative changes of the lower lumbar spine.

Vascular calcifications.
IMPRESSION: No fracture or dislocation is seen.

## 2019-05-08 DEATH — deceased
